# Patient Record
Sex: Male | Born: 1950
Health system: Southern US, Community
[De-identification: ages and names within clinical notes are randomized; demographics above are authoritative.]

## PROBLEM LIST (undated history)

## (undated) DIAGNOSIS — I10 Essential (primary) hypertension: Secondary | ICD-10-CM

## (undated) DIAGNOSIS — R011 Cardiac murmur, unspecified: Secondary | ICD-10-CM

## (undated) DIAGNOSIS — J302 Other seasonal allergic rhinitis: Secondary | ICD-10-CM

## (undated) DIAGNOSIS — I350 Nonrheumatic aortic (valve) stenosis: Secondary | ICD-10-CM

## (undated) HISTORY — DX: Other seasonal allergic rhinitis: J30.2

## (undated) HISTORY — DX: Nonrheumatic aortic (valve) stenosis: I35.0

## (undated) HISTORY — DX: Essential (primary) hypertension: I10

## (undated) HISTORY — DX: Cardiac murmur, unspecified: R01.1

---

## 2009-06-01 ENCOUNTER — Ambulatory Visit: Payer: Self-pay | Admitting: Family Medicine

## 2010-10-18 ENCOUNTER — Encounter: Payer: Self-pay | Admitting: Family Medicine

## 2011-09-12 ENCOUNTER — Other Ambulatory Visit (INDEPENDENT_AMBULATORY_CARE_PROVIDER_SITE_OTHER): Payer: 59

## 2011-09-12 ENCOUNTER — Other Ambulatory Visit: Payer: Self-pay | Admitting: Family Medicine

## 2011-09-12 DIAGNOSIS — Z139 Encounter for screening, unspecified: Secondary | ICD-10-CM

## 2011-09-12 DIAGNOSIS — Z111 Encounter for screening for respiratory tuberculosis: Secondary | ICD-10-CM

## 2011-09-14 LAB — TB SKIN TEST: TB Skin Test: NEGATIVE

## 2011-09-14 LAB — RUBEOLA ANTIBODY IGG: Rubeola IgG: 1.65 {ISR} — ABNORMAL HIGH

## 2011-09-14 LAB — VARICELLA ZOSTER ANTIBODY, IGG: Varicella IgG: 3.34 {ISR} — ABNORMAL HIGH

## 2011-09-14 LAB — MUMPS ANTIBODY, IGG: Mumps IgG: 1.28 {ISR} — ABNORMAL HIGH

## 2011-11-08 ENCOUNTER — Telehealth: Payer: Self-pay | Admitting: *Deleted

## 2011-11-08 ENCOUNTER — Ambulatory Visit (INDEPENDENT_AMBULATORY_CARE_PROVIDER_SITE_OTHER): Payer: 59 | Admitting: Family Medicine

## 2011-11-08 ENCOUNTER — Encounter: Payer: Self-pay | Admitting: Family Medicine

## 2011-11-08 VITALS — BP 150/98 | HR 68 | Temp 98.2°F | Ht 69.0 in | Wt 175.0 lb

## 2011-11-08 DIAGNOSIS — J309 Allergic rhinitis, unspecified: Secondary | ICD-10-CM

## 2011-11-08 DIAGNOSIS — R011 Cardiac murmur, unspecified: Secondary | ICD-10-CM

## 2011-11-08 MED ORDER — TRIAMCINOLONE ACETONIDE 0.1 % EX CREA: TOPICAL_CREAM | Freq: Two times a day (BID) | CUTANEOUS | Status: AC

## 2011-11-08 NOTE — Patient Instructions (Signed)
For your allergies:  Use EITHER claritin (loratidine) or Zyrtec (cetirizine) once daily.  Do NOT use the D version of these medications, since they can raise blood pressure, and your BP was high.  If having sinus pain, try sinus rinses or Neti-pot.  The medications for allergies likely will also treat the eye allergy symptoms.  If it isn't enough, then try drops such as Naphcon-A, or Zaditor, or other antihistamine/anti-inflammatory drops.  Use the steroid cream sparingly, only to the affected areas of skin.  Do not use it longterm.    We are going to be referring you to the cardiologists to have an echocardiogram--this is an ultrasound of the heart to evaluate the heart murmur heard today, to see if there are any valvular abnormalities.  Your blood pressure was elevated today.  Please follow a low sodium diet, avoid decongestants (sinus medications, "D" meds), and periodically check BP at pharmacy or work.  Normal is <135/85, ideally even lower.  2 Gram Low Sodium Diet A 2 gram sodium diet restricts the amount of sodium in the diet to no more than 2 g or 2000 mg daily. Limiting the amount of sodium is often used to help lower blood pressure. It is important if you have heart, liver, or kidney problems. Many foods contain sodium for flavor and sometimes as a preservative. When the amount of sodium in a diet needs to be low, it is important to know what to look for when choosing foods and drinks. The following includes some information and guidelines to help make it easier for you to adapt to a low sodium diet. QUICK TIPS  Do not add salt to food.   Avoid convenience items and fast food.   Choose unsalted snack foods.   Buy lower sodium products, often labeled as "lower sodium" or "no salt added."   Check food labels to learn how much sodium is in 1 serving.   When eating at a restaurant, ask that your food be prepared with less salt or none, if possible.  READING FOOD LABELS FOR SODIUM  INFORMATION The nutrition facts label is a good place to find how much sodium is in foods. Look for products with no more than 500 to 600 mg of sodium per meal and no more than 150 mg per serving. Remember that 2 g = 2000 mg. The food label may also list foods as:  Sodium-free: Less than 5 mg in a serving.   Very low sodium: 35 mg or less in a serving.   Low-sodium: 140 mg or less in a serving.   Light in sodium: 50% less sodium in a serving. For example, if a food that usually has 300 mg of sodium is changed to become light in sodium, it will have 150 mg of sodium.   Reduced sodium: 25% less sodium in a serving. For example, if a food that usually has 400 mg of sodium is changed to reduced sodium, it will have 300 mg of sodium.  CHOOSING FOODS Grains  Avoid: Salted crackers and snack items. Some cereals, including instant hot cereals. Bread stuffing and biscuit mixes. Seasoned rice or pasta mixes.   Choose: Unsalted snack items. Low-sodium cereals, oats, puffed wheat and rice, shredded wheat. English muffins and bread. Pasta.  Meats  Avoid: Salted, canned, smoked, spiced, pickled meats, including fish and poultry. Bacon, ham, sausage, cold cuts, hot dogs, anchovies.   Choose: Low-sodium canned tuna and salmon. Fresh or frozen meat, poultry, and fish.  Dairy  Avoid: Processed cheese and spreads. Cottage cheese. Buttermilk and condensed milk. Regular cheese.   Choose: Milk. Low-sodium cottage cheese. Yogurt. Sour cream. Low-sodium cheese.  Fruits and Vegetables  Avoid: Regular canned vegetables. Regular canned tomato sauce and paste. Frozen vegetables in sauces. Olives. Rosita Fire. Relishes. Sauerkraut.   Choose: Low-sodium canned vegetables. Low-sodium tomato sauce and paste. Frozen or fresh vegetables. Fresh and frozen fruit.  Condiments  Avoid: Canned and packaged gravies. Worcestershire sauce. Tartar sauce. Barbecue sauce. Soy sauce. Steak sauce. Ketchup. Onion, garlic, and table  salt. Meat flavorings and tenderizers.   Choose: Fresh and dried herbs and spices. Low-sodium varieties of mustard and ketchup. Lemon juice. Tabasco sauce. Horseradish.  SAMPLE 2 GRAM SODIUM MEAL PLAN Breakfast / Sodium (mg)  1 cup low-fat milk / 143 mg   2 slices whole-wheat toast / 270 mg   1 tbs heart-healthy margarine / 153 mg   1 hard-boiled egg / 139 mg   1 small orange / 0 mg  Lunch / Sodium (mg)  1 cup raw carrots / 76 mg    cup hummus / 298 mg   1 cup low-fat milk / 143 mg    cup red grapes / 2 mg   1 whole-wheat pita bread / 356 mg  Dinner / Sodium (mg)  1 cup whole-wheat pasta / 2 mg   1 cup low-sodium tomato sauce / 73 mg   3 oz lean ground beef / 57 mg   1 small side salad (1 cup raw spinach leaves,  cup cucumber,  cup yellow bell pepper) with 1 tsp olive oil and 1 tsp red wine vinegar / 25 mg  Snack / Sodium (mg)  1 container low-fat vanilla yogurt / 107 mg   3 graham cracker squares / 127 mg  Nutrient Analysis  Calories: 2033   Protein: 77 g   Carbohydrate: 282 g   Fat: 72 g   Sodium: 1971 mg  Document Released: 04/09/2005 Document Revised: 03/29/2011 Document Reviewed: 07/11/2009 [facility] Patient Information 2012 [place], [place].

## 2011-11-08 NOTE — Progress Notes (Signed)
Chief Complaint  Patient presents with  . Allergies    itchy eyes and feet seasonally. Also has been sneezing a lot. Patient brings a list ans states that loratadine, triamcinolone cream and erythromycin eye drops are effective in helping his symptoms he is out of these meds and requests rx.   HPI:  Symptoms developed yesterday after cutting the grass. He has symptoms every year this time, affected by being outside, mowing and walking on the grass.  He has not used any OTC medications. In the past has used loratidine for allergies, and erythromycin for his eyes--hasn't used in a while.  Denies any significant crusting of the eyes, but are watery, itchy and sometimes red.  Rash on top of his foot, which he relates to wearing socks. TAC cream was helpful in resolving rash in the past.+itchy.  Denies weeping, redness, warmth or fever  Past Medical History  Diagnosis Date  . Seasonal allergies     History reviewed. No pertinent past surgical history.  History   Social History  . Marital Status: Married    Spouse Name: N/A    Number of Children: N/A  . 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 Smoking status: Never Smoker   . Smokeless tobacco: Never Used  . Alcohol Use: Yes     1 drink per month.  . Drug Use: No  . Sexually Active: Not on file   Other Topics Concern  . Not on file   Social History Narrative  . No narrative on file   Current outpatient prescriptions:triamcinolone cream (KENALOG) 0.1 %, Apply topically 2 (two) times daily. Use sparingly, twice daily, until rash resolved., Disp: 45 g, Rfl: 0  No Known Allergies  ROS:  Denies fevers, sinus pain, sore throat, cough, shortness of breath.  Some postnasal drip.  Denies chest pain, edema, or other concerns except as noted in HPI.  Denies headaches, dizziness  PHYSICAL EXAM: BP 150/98  Pulse 68  Temp 98.2 F (36.8 C) (Oral)  Ht 5\' 9"  (1.753 m)  Wt 175  lb (79.379 kg)  BMI 25.84 kg/m2 152/96 by MD on RA Well developed male, in no distress. HEENT:  PERRL, EOMI, conjunctiva clear.  OP clear.  Nasal mucosa moderately edematous, pale. No purulence.  Sinuses nontender.  TMs and EAC's normal Neck: no lymphadenopathy, thyromegaly Heart: regular rate and rhythm.  SEM heard throughout, blowing/high pitched at base.   Lungs: clear bilaterally with good air movement Abdomen: soft, nontender Extremities: no edema. Skin: Hyperpigmented area on top of feet. Somewhat dry. No erythema, warmth, drainage  ASSESSMENT/PLAN: 1. Heart murmur  2D Echocardiogram without contrast  2. Allergic rhinitis, cause unspecified     Allergies--OTC antihistamines (ie claritin or zyrtec). Avoid decongestants.  Sinus rinses prn.  This should help with eye symptoms as well, and if it doesn't, then use OTC eye drops.  Discussed why ABX are not indicated, no evidence of bacterial infection.  Heart murmur--echocardiogram ordered  Elevated BP--low sodium diet, check BP elsewhere periodically  Return for CPE

## 2011-11-08 NOTE — Telephone Encounter (Signed)
Left message for patient to let him know that he is scheduled @ [facility] [address] [place] 11/15/11 @ 10:30.

## 2011-11-15 ENCOUNTER — Encounter: Payer: Self-pay | Admitting: Family Medicine

## 2011-11-15 ENCOUNTER — Ambulatory Visit (HOSPITAL_COMMUNITY): Payer: 59 | Attending: Cardiology | Admitting: Radiology

## 2011-11-15 DIAGNOSIS — R011 Cardiac murmur, unspecified: Secondary | ICD-10-CM

## 2011-11-15 DIAGNOSIS — I350 Nonrheumatic aortic (valve) stenosis: Secondary | ICD-10-CM | POA: Insufficient documentation

## 2011-11-15 DIAGNOSIS — I080 Rheumatic disorders of both mitral and aortic valves: Secondary | ICD-10-CM | POA: Insufficient documentation

## 2011-11-15 NOTE — Progress Notes (Signed)
Echocardiogram performed.  

## 2011-11-20 NOTE — Progress Notes (Signed)
Called pt cell # left message to please call us back

## 2011-12-18 ENCOUNTER — Ambulatory Visit (INDEPENDENT_AMBULATORY_CARE_PROVIDER_SITE_OTHER): Payer: 59 | Admitting: Cardiovascular Disease

## 2011-12-18 ENCOUNTER — Ambulatory Visit (INDEPENDENT_AMBULATORY_CARE_PROVIDER_SITE_OTHER)
Admission: RE | Admit: 2011-12-18 | Discharge: 2011-12-18 | Disposition: A | Discharge status: Home / Self Care | Payer: 59 | Source: Ambulatory Visit | Attending: Cardiovascular Disease | Admitting: Cardiovascular Disease

## 2011-12-18 ENCOUNTER — Encounter: Payer: Self-pay | Admitting: Cardiovascular Disease

## 2011-12-18 VITALS — BP 153/85 | HR 89 | Ht 69.0 in | Wt 175.4 lb

## 2011-12-18 DIAGNOSIS — I350 Nonrheumatic aortic (valve) stenosis: Secondary | ICD-10-CM

## 2011-12-18 DIAGNOSIS — I359 Nonrheumatic aortic valve disorder, unspecified: Secondary | ICD-10-CM

## 2011-12-18 DIAGNOSIS — I10 Essential (primary) hypertension: Secondary | ICD-10-CM

## 2011-12-18 LAB — BASIC METABOLIC PANEL
BUN: 18 mg/dL (ref 6–23)
Creatinine, Ser: 1.1 mg/dL (ref 0.4–1.5)
GFR: 73.61 mL/min (ref >60.00)

## 2011-12-18 LAB — BRAIN NATRIURETIC PEPTIDE: Pro B Natriuretic peptide (BNP): 7 pg/mL (ref 0.0–100.0)

## 2011-12-18 MED ORDER — AMLODIPINE BESYLATE 5 MG PO TABS
5.0000 mg | ORAL_TABLET | Freq: Every day | ORAL | Status: DC
Start: 2011-12-18 — End: 2014-03-25

## 2011-12-18 MED ORDER — IOHEXOL 350 MG/ML SOLN
100.0000 mL | Freq: Once | INTRAVENOUS | Status: AC | PRN
  Administered 2011-12-18: 100 mL via INTRAVENOUS

## 2011-12-18 NOTE — Patient Instructions (Addendum)
Your physician recommends that you have lab work today: BMP and BNP  Non-Cardiac CT Angiography (CTA), is a special type of CT scan that uses a computer to produce multi-dimensional views of major blood vessels throughout the body. In CT angiography, a contrast material is injected through an IV to help visualize the blood vessels--CTA of Chest with contrast  Your physician has requested that you have an exercise tolerance test with Dr Excell Seltzer within 1-2 WEEKS . For further information please visit [email or web address] Please also follow instruction sheet, as given.  Your physician has recommended you make the following change in your medication: START Amlodipine 5mg  take one by mouth daily   You do need SBE prophylaxis prior to dental procedures:  Amoxicillin 500mg  take 4 tablets one hour prior to dental work

## 2011-12-18 NOTE — Progress Notes (Signed)
HPI:  61 year old gentleman presenting for initial cardiac evaluation. The patient was recently seen by Dr. Lynelle Doctor and was noted to have a cardiac murmur. He was referred for an echocardiogram that demonstrated severe aortic stenosis. He is now referred for further evaluation.  This was the first the patient has never been told of a heart murmur. He's had several job physicals but this has never been discussed with him in the past. He has no known history of congenital heart disease, coronary disease, or other cardiac problems. 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  He has no symptoms at this time. He specifically denies chest pain or pressure, dyspnea, lightheadedness, or syncope. He's had no palpitations, edema, orthopnea, or PND. The patient has never been hospitalized. He's had no past surgeries. In order to test his symptoms, he actually ran up and down stairs several times before the visit. He had no symptoms with that level of activity. He exercises a few times per week with pushups and weightlifting, but he has not engaged in any routine aerobic exercise. He does not recall any history of rheumatic fever or other prolonged illness as a child. He has no family members who have had valvular heart problems or congenital heart disease.  Outpatient Encounter Prescriptions as of 12/18/2011  Medication Sig Dispense Refill  . triamcinolone cream (KENALOG) 0.1 % Apply topically 2 (two) times daily. Use sparingly, twice daily, until rash resolved.  45 g  0    Review of patient's allergies indicates no known allergies.  Past Medical History  Diagnosis Date  . Seasonal allergies     No past surgical history on file.  History   Social History  . Marital Status: Married    Spouse Name: N/A    Number of Children: N/A  . 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 Smoking status: Never Smoker   . Smokeless tobacco: Never Used  . Alcohol Use: Yes     1 drink per month.  . Drug Use: No  . Sexually Active: Not on file   Other Topics Concern  . Not on file   Social History Narrative  . No narrative on file   Family history: Negative for coronary disease, sudden death, congenital heart disease, or valvular heart problems  ROS: General: no fevers/chills/night sweats Eyes: no blurry vision, diplopia, or amaurosis ENT: no sore throat or hearing loss Resp: no cough, wheezing, or hemoptysis CV: no edema or palpitations GI: no abdominal pain, nausea, vomiting, diarrhea, or constipation GU: no dysuria, frequency, or hematuria Skin: no rash Neuro: no headache, numbness, tingling, or weakness of extremities Musculoskeletal: no joint pain or swelling Heme: no bleeding, DVT, or easy bruising Endo: no polydipsia or polyuria  BP 153/85  Pulse 89  Ht 5\' 9"  (1.753 m)  Wt 175 lb 6.4 oz (79.561 kg)  BMI 25.90 kg/m2  PHYSICAL EXAM: Pt is alert and oriented, WD, WN, in no distress. HEENT: normal Neck: JVP normal. Carotid upstrokes normal without bruits. No thyromegaly. Lungs: equal expansion, clear bilaterally CV: Apex is discrete and nondisplaced, RRR with a grade 3/6 harsh crescendo decrescendo murmur heard throughout. The A2 component second heart sound is heard. There is no diastolic murmur present. Abd: soft, NT, +BS, no bruit, no hepatosplenomegaly Back: no CVA tenderness Ext: no C/C/E  Femoral pulses 2+= without bruits        DP/PT pulses intact and = Skin: warm and dry without rash Neuro: CNII-XII intact             Strength intact = bilaterally  EKG:  Sinus rhythm 85 beats per minute, right bundle branch block.  2D ECHO: Left ventricle: The cavity size was normal. There was mild concentric hypertrophy. Systolic function was normal. The estimated ejection fraction was in the range of 55% to 60%. Wall motion  was normal; there were no regional wall motion abnormalities. Doppler parameters are consistent with abnormal left ventricular relaxation (grade 1 diastolic dysfunction).  ------------------------------------------------------------ Aortic valve: Trileaflet; severely calcified leaflets. Doppler: There was severe stenosis. Mild regurgitation. VTI ratio of LVOT to aortic valve: 0.19. Indexed valve area: 0.4cm^2/m^2 (VTI). Peak velocity ratio of LVOT to aortic valve: 0.19. Indexed valve area: 0.4cm^2/m^2 (Vmax). Mean gradient: 55mm Hg (S). Peak gradient: 78mm Hg (S).  ------------------------------------------------------------ Aorta: Aortic root: The aortic root was normal in size. Ascending aorta: The ascending aorta was mildly dilated.  ------------------------------------------------------------ Mitral valve: Structurally normal valve. Leaflet separation was normal. Doppler: Transvalvular velocity was within the normal range. There was no evidence for stenosis. Trivial regurgitation. Peak gradient: 3mm Hg (D).  ------------------------------------------------------------ Left atrium: The atrium was mildly dilated.  ------------------------------------------------------------ Right ventricle: The cavity size was normal. Wall thickness was normal. Systolic function was normal.  ------------------------------------------------------------ Pulmonic valve: Poorly visualized. Doppler: No significant regurgitation.  ------------------------------------------------------------ Tricuspid valve: Structurally normal valve. Leaflet separation was normal. Doppler: Transvalvular velocity was within the normal range. No regurgitation.  ------------------------------------------------------------ Right atrium: The atrium was at the upper limits of normal in size.  ------------------------------------------------------------ Pericardium: There was no pericardial  effusion.  ------------------------------------------------------------ Systemic veins: Inferior vena cava: The vessel was dilated; the respirophasic diameter changes were blunted (< 50%); findings are consistent with elevated central venous pressure.  ------------------------------------------------------------  2D measurements Normal Doppler measurements Normal Left ventricle Left ventricle LVID ED, 38.3 mm 43-52 Ea, lat 8.66 cm/s ------ chord, ann, tiss PLAX DP LVID ES, 23.5 mm 23-38 E/Ea, lat 9.46 ------ chord, ann, tiss PLAX DP FS, chord, 39 % >29 Ea, med 6.8 cm/s ------ PLAX ann, tiss LVPW, ED 13.4 mm ------ DP IVS/LVPW 0.78 <1.3 E/Ea, med 12.0 ------ ratio, ED ann, tiss 4 Ventricular septum DP IVS, ED 10.5 mm ------ LVOT LVOT Peak vel, 82 cm/s ------ Diam, S 23 mm ------ S Area 4.15 cm^2 ------ VTI, S 19.8 cm ------ Diam 23 mm ------ Stroke vol 82.3 ml ------ Aorta Stroke 42.4 ml/m^2 ------ Root diam, 30 mm ------ index ED Aortic valve AAo AP 41 mm ------ Peak vel, 441 cm/s ------ diam, S S Left atrium Mean vel, 359 cm/s ------ AP dim 35 mm ------ S AP dim 1.8 cm/m^2 <2.2 VTI, S 106 cm ------ index Mean 55 mm Hg ------ gradient, S Peak 78 mm Hg ------ gradient, S VTI ratio 0.19 ------ LVOT/AV Area index 0.4 cm^2/m ------ (VTI) ^2 Peak vel 0.19 ------ ratio, LVOT/AV Area index 0.4 cm^2/m ------ (Vmax) ^2 Regurg PHT 794 ms ------ Mitral valve Peak E vel 81.9 cm/s ------ Peak A vel 104 cm/s ------ Decelerati 239 ms 150-23 on time 0 Peak 3 mm Hg ------ gradient, D Peak E/A 0.8 ------ ratio Right ventricle Sa vel, 13.7 cm/s ------ lat ann, tiss DP  ASSESSMENT AND PLAN:

## 2011-12-18 NOTE — Assessment & Plan Note (Signed)
This patient has severe aortic stenosis by echocardiography. I have personally reviewed his echo images. I suspect he has a bicuspid aortic valve based on his age at presentation. The other disease process and the differential diagnosis is rheumatic valvular disease, but his mitral valve appears morphologically normal. I think this is highly unlikely. Because I suspect bicuspid aortic valve stenosis, I have recommended the patient have a CT angiogram of the chest to rule out aortic root aneurysm.  I had along discussion with the patient and his wife regarding the ramifications of severe aortic stenosis. There is clearly some controversy about treatment in this gentleman who is completely asymptomatic at a good work level. I think this is a good area for exercise treadmill testing and this will allow [place] to rule out high risk features. If he performs well on his exercise treadmill study in has no hemodynamic or EKG problems identified, I would be inclined to manage him medically for now. I will discuss this further with them when I see him back in followup. Will also check a BNP as there is some prognostic value added in patients with aortic stenosis. We'll have further discussion with the patient and his wife when he returns to review his CT scan and exercise treadmill study. He will need to follow SBE prophylaxis.

## 2011-12-18 NOTE — Progress Notes (Signed)
i think this was suppose to go to you

## 2011-12-18 NOTE — Assessment & Plan Note (Signed)
The patient has had consistently elevated blood pressure. We'll give him a trial of amlodipine 5 mg daily.

## 2011-12-28 ENCOUNTER — Encounter: Payer: Self-pay | Admitting: Cardiovascular Disease

## 2011-12-28 ENCOUNTER — Ambulatory Visit (INDEPENDENT_AMBULATORY_CARE_PROVIDER_SITE_OTHER): Payer: 59 | Admitting: Cardiovascular Disease

## 2011-12-28 DIAGNOSIS — I350 Nonrheumatic aortic (valve) stenosis: Secondary | ICD-10-CM

## 2011-12-28 DIAGNOSIS — I359 Nonrheumatic aortic valve disorder, unspecified: Secondary | ICD-10-CM

## 2011-12-28 MED ORDER — AMOXICILLIN 500 MG PO CAPS: ORAL_CAPSULE | ORAL | Status: DC

## 2011-12-28 NOTE — Addendum Note (Signed)
Addended by: Lisabeth Devoid F on: 12/28/2011 12:30 PM   Modules accepted: Orders

## 2011-12-28 NOTE — Procedures (Signed)
Exercise Treadmill Test  Pre-Exercise Testing Evaluation Rhythm: normal sinus /rbbb Rate: 69   PR:  .14 QRS:  .15  QT:  .42 QTc: .45     Test  Exercise Tolerance Test Ordering MD: Tonny Bollman, MD  Interpreting MD: Tonny Bollman, MD  Unique Test No: 1  Treadmill:  1  Indication for ETT: known ASHD  Contraindication to ETT: No   Stress Modality: exercise - treadmill  Cardiac Imaging Performed: non   Protocol: standard Bruce - maximal  Max BP:  176/94  Max MPHR (bpm):  162 85% MPR (bpm):  138  MPHR obtained (bpm):  164 % MPHR obtained:  101  Reached 85% MPHR (min:sec): 5:00 Total Exercise Time (min-sec):  9:00  Workload in METS:  10.1 Borg Scale: 15  Reason ETT Terminated:  desired heart rate attained    ST Segment Analysis At Rest: normal ST segments - no evidence of significant ST depression With Exercise: no evidence of significant ST depression  Other Information Arrhythmia:  Yes Angina during ETT:  absent (0) Quality of ETT:  diagnostic  ETT Interpretation:  normal - no evidence of ischemia by ST analysis  Comments: Good exercise tolerance. No chest pain or significant ST changes with exertion. Normal BP response to exercise. Isolated PVC's in recovery.

## 2012-01-02 ENCOUNTER — Encounter: Payer: 59 | Admitting: Cardiothoracic Surgery

## 2012-01-02 ENCOUNTER — Telehealth: Payer: Self-pay | Admitting: Cardiovascular Disease

## 2012-01-02 NOTE — Telephone Encounter (Signed)
After several calls to  Mr. Carl Walker, to inform him of an appointment with Dr. Tyrone Sage on 01-02-12 @ 4 pm I was able to speak  with Carl Walker  last night.  She stated that they discussed what Dr. Excell Seltzer had told them and the findings was not an emergency. They don't wan to keep the appointment.  Will call back when ready to see Dr. Tyrone Sage.

## 2012-01-02 NOTE — Telephone Encounter (Signed)
I will forward this message to Dr Excell Seltzer as an Lorain Childes. The pt cancelled appointment with Dr Tyrone Sage.  No further follow-up scheduled at this time.

## 2012-06-20 ENCOUNTER — Ambulatory Visit (INDEPENDENT_AMBULATORY_CARE_PROVIDER_SITE_OTHER): Payer: 59 | Admitting: Cardiovascular Disease

## 2012-06-20 ENCOUNTER — Encounter: Payer: Self-pay | Admitting: Cardiovascular Disease

## 2012-06-20 VITALS — BP 148/98 | HR 69 | Ht 69.0 in | Wt 178.0 lb

## 2012-06-20 DIAGNOSIS — I10 Essential (primary) hypertension: Secondary | ICD-10-CM

## 2012-06-20 DIAGNOSIS — I359 Nonrheumatic aortic valve disorder, unspecified: Secondary | ICD-10-CM

## 2012-06-20 DIAGNOSIS — I350 Nonrheumatic aortic (valve) stenosis: Secondary | ICD-10-CM

## 2012-06-20 LAB — HEPATIC FUNCTION PANEL
Albumin: 4.2 g/dL (ref 3.5–5.2)
Alkaline Phosphatase: 48 U/L (ref 39–117)
Bilirubin, Direct: 0.1 mg/dL (ref 0.0–0.3)
Total Bilirubin: 0.8 mg/dL (ref 0.3–1.2)

## 2012-06-20 LAB — BASIC METABOLIC PANEL
Calcium: 9.4 mg/dL (ref 8.4–10.5)
GFR: 80.24 mL/min (ref >60.00)
Glucose, Bld: 95 mg/dL (ref 70–99)
Sodium: 139 mEq/L (ref 135–145)

## 2012-06-20 LAB — CBC
HCT: 42.5 % (ref 39.0–52.0)
MCHC: 33.3 g/dL (ref 30.0–36.0)
MCV: 86.8 fl (ref 78.0–100.0)
RDW: 12.9 % (ref 11.5–14.6)

## 2012-06-20 LAB — LIPID PANEL
HDL: 53.9 mg/dL (ref >39.00)
VLDL: 7.2 mg/dL (ref 0.0–40.0)

## 2012-06-20 LAB — BRAIN NATRIURETIC PEPTIDE: Pro B Natriuretic peptide (BNP): 9 pg/mL (ref 0.0–100.0)

## 2012-06-20 NOTE — Patient Instructions (Addendum)
Your physician has requested that you have an echocardiogram. Echocardiography is a painless test that uses sound waves to create images of your heart. It provides your doctor with information about the size and shape of your heart and how well your heart's chambers and valves are working. This procedure takes approximately one hour. There are no restrictions for this procedure.  Your physician recommends that you have lab work today: CBC, BMP, LIVER, LIPID and BNP  Your physician wants you to follow-up in: 6 MONTHS with Dr Excell Seltzer.  You will receive a reminder letter in the mail two months in advance. If you don't receive a letter, please call our office to schedule the follow-up appointment.  Your physician has requested that you regularly monitor and record your blood pressure readings at home. Please use the same machine at the same time of day to check your readings and record them to bring to your follow-up visit.

## 2012-06-20 NOTE — Progress Notes (Signed)
HPI:   62 year old gentleman presenting for followup of severe aortic stenosis. The patient was diagnosed with severe aortic stenosis last year. His mean transaortic valve gradient was 55 mm mercury. His left ventricular function is normal. The patient has been asymptomatic and he specifically denies shortness of breath, chest pain with exertion or at rest, palpitations, lightheadedness, or syncope. He travel to [place] recently for 3 weeks and when he came back he noted that his blood pressure was lower. He stopped taking amlodipine. His wife thinks he has whitecoat hypertension as his blood pressure is elevated today.  Outpatient Encounter Prescriptions as of 06/20/2012  Medication Sig Dispense Refill  . amLODipine (NORVASC) 5 MG tablet Take 1 tablet (5 mg total) by mouth daily.  90 tablet  3  . amoxicillin (AMOXIL) 500 MG capsule Take 4 capsules 1 hour prior to your dental procedure  4 capsule  1  . triamcinolone cream (KENALOG) 0.1 % Apply topically 2 (two) times daily. Use sparingly, twice daily, until rash resolved.  45 g  0   No facility-administered encounter medications on file as of 06/20/2012.    No Known Allergies  Past Medical History  Diagnosis Date  . Seasonal allergies     ROS: Negative except as per HPI  BP 148/98  Pulse 69  Ht 5\' 9"  (1.753 m)  Wt 80.74 kg (178 lb)  BMI 26.27 kg/m2  SpO2 98%  PHYSICAL EXAM: Pt is alert and oriented, NAD HEENT: normal Neck: JVP - normal, carotids show parvus et tardus Lungs: CTA bilaterally CV: RRR with grade 3/6 harsh cresendo decrescendo murmur loudest at the RUSB. The second heart sound is preserved. Abd: soft, NT, Positive BS, no hepatomegaly Ext: no C/C/E, distal pulses intact and equal Skin: warm/dry no rash  EKG:  NSR with RBBB, HR 69 bpm.  Echo 11/15/2011: Left ventricle: The cavity size was normal. There was mild concentric hypertrophy. Systolic function was normal. The estimated ejection fraction was in the range  of 55% to 60%. Wall motion was normal; there were no regional wall motion abnormalities. Doppler parameters are consistent with abnormal left ventricular relaxation (grade 1 diastolic dysfunction).  ------------------------------------------------------------ Aortic valve: Trileaflet; severely calcified leaflets. Doppler: There was severe stenosis. Mild regurgitation. VTI ratio of LVOT to aortic valve: 0.19. Indexed valve area: 0.4cm^2/m^2 (VTI). Peak velocity ratio of LVOT to aortic valve: 0.19. Indexed valve area: 0.4cm^2/m^2 (Vmax). Mean gradient: 55mm Hg (S). Peak gradient: 78mm Hg (S).  ------------------------------------------------------------ Aorta: Aortic root: The aortic root was normal in size. Ascending aorta: The ascending aorta was mildly dilated.  ------------------------------------------------------------ Mitral valve: Structurally normal valve. Leaflet separation was normal. Doppler: Transvalvular velocity was within the normal range. There was no evidence for stenosis. Trivial regurgitation. Peak gradient: 3mm Hg (D).  ------------------------------------------------------------ Left atrium: The atrium was mildly dilated.  ------------------------------------------------------------ Right ventricle: The cavity size was normal. Wall thickness was normal. Systolic function was normal.  ------------------------------------------------------------ Pulmonic valve: Poorly visualized. Doppler: No significant regurgitation.  ------------------------------------------------------------ Tricuspid valve: Structurally normal valve. Leaflet separation was normal. Doppler: Transvalvular velocity was within the normal range. No regurgitation.  ------------------------------------------------------------ Right atrium: The atrium was at the upper limits of normal in size.  ------------------------------------------------------------ Pericardium: There was no  pericardial effusion.  ------------------------------------------------------------ Systemic veins: Inferior vena cava: The vessel was dilated; the respirophasic diameter changes were blunted (< 50%); findings are consistent with elevated central venous pressure.  ------------------------------------------------------------  2D measurements Normal Doppler measurements Normal Left ventricle Left ventricle LVID ED, 38.3 mm 43-52 Ea,  lat 8.66 cm/s ------ chord, ann, tiss PLAX DP LVID ES, 23.5 mm 23-38 E/Ea, lat 9.46 ------ chord, ann, tiss PLAX DP FS, chord, 39 % >29 Ea, med 6.8 cm/s ------ PLAX ann, tiss LVPW, ED 13.4 mm ------ DP IVS/LVPW 0.78 <1.3 E/Ea, med 12.0 ------ ratio, ED ann, tiss 4 Ventricular septum DP IVS, ED 10.5 mm ------ LVOT LVOT Peak vel, 82 cm/s ------ Diam, S 23 mm ------ S Area 4.15 cm^2 ------ VTI, S 19.8 cm ------ Diam 23 mm ------ Stroke vol 82.3 ml ------ Aorta Stroke 42.4 ml/m^2 ------ Root diam, 30 mm ------ index ED Aortic valve AAo AP 41 mm ------ Peak vel, 441 cm/s ------ diam, S S Left atrium Mean vel, 359 cm/s ------ AP dim 35 mm ------ S AP dim 1.8 cm/m^2 <2.2 VTI, S 106 cm ------ index Mean 55 mm Hg ------ gradient, S Peak 78 mm Hg ------ gradient, S VTI ratio 0.19 ------ LVOT/AV Area index 0.4 cm^2/m ------ (VTI) ^2 Peak vel 0.19 ------ ratio, LVOT/AV Area index 0.4 cm^2/m ------ (Vmax) ^2 Regurg PHT 794 ms ------ Mitral valve Peak E vel 81.9 cm/s ------ Peak A vel 104 cm/s ------ Decelerati 239 ms 150-23 on time 0 Peak 3 mm Hg ------ gradient, D Peak E/A 0.8 ------ ratio Right ventricle Sa vel, 13.7 cm/s ------ lat ann, tiss DP  Chest CTA: IMPRESSION:  1. Mild ectasia of the ascending thoracic aorta measuring  approximately 4.1 cm in greatest transverse axial dimension.  2. Incidental note is made of a ductus diverticulum. No evidence  of thoracic aortic dissection.  3. Extensive calcification within the  aortic valve leaflets  compatible with provided history of aortic stenosis.  4. Possible 1.4 cm hemangioma versus minimally complex cyst within  the dome of the left lobe of the liver, incompletely characterized.  ASSESSMENT AND PLAN: 1. Severe aortic stenosis, asymptomatic. I have reviewed the patient's echocardiogram, chest CTA, an exercise treadmill test all done approximately 6 months ago. He achieved 10.1 metabolic equivalents, exercising 9 minutes on a stress test with normal blood pressure response, no angina, and no significant EKG changes. I remain concerned about the very high gradient across his aortic valve. I am going to recheck an echocardiogram to assess for progression of his aortic stenosis. Also we'll check a BNP level as this can be helpful in asymptomatic patients. Will review with him further when his tests are completed.  2. Hypertension. Isolated blood pressure elevation today. His wife is a Publishing rights manager and we'll check his blood pressure periodically at home. If his readings are greater than 140/90, I recommended that he restart amlodipine 5 mg daily.  Tonny Bollman 06/20/2012 11:12 AM

## 2012-07-04 ENCOUNTER — Ambulatory Visit (HOSPITAL_COMMUNITY): Payer: 59 | Attending: Cardiology | Admitting: Radiology

## 2012-07-04 DIAGNOSIS — I10 Essential (primary) hypertension: Secondary | ICD-10-CM

## 2012-07-04 DIAGNOSIS — I350 Nonrheumatic aortic (valve) stenosis: Secondary | ICD-10-CM

## 2012-07-04 DIAGNOSIS — I359 Nonrheumatic aortic valve disorder, unspecified: Secondary | ICD-10-CM

## 2012-07-04 NOTE — Progress Notes (Signed)
Echocardiogram performed.  

## 2012-12-15 ENCOUNTER — Telehealth: Payer: Self-pay | Admitting: Cardiovascular Disease

## 2012-12-15 DIAGNOSIS — I359 Nonrheumatic aortic valve disorder, unspecified: Secondary | ICD-10-CM

## 2012-12-15 NOTE — Telephone Encounter (Signed)
Spoke with patient, discussed echo results from 07/04/12 and Dr Earmon Phoenix recommendation to refer to Dr Tyrone Sage for consideration of AVR. Pt is aware I will make the referral to Dr Tyrone Sage,  pt states Fridays the best day.

## 2012-12-15 NOTE — Telephone Encounter (Signed)
New Prob  Pt states he was supposed to call and speak with Dr Excell Seltzer.  He said he does not know what it was about but he said he missed a call from him but he was out of town and just got the message.

## 2012-12-15 NOTE — Telephone Encounter (Signed)
Reviewed chart. Pt is asymptomatic but has very high transaortic gradients suggesting higher risk. It has been nearly one year since his treadmill. Recommend repeat treadmill and echo in September before seeing Dr Tyrone Sage so that he has updated clinical information. Please schedule his treadmill for me to do rather than PA/NP. thx  Tonny Bollman 12/15/2012 10:24 AM

## 2012-12-18 NOTE — Addendum Note (Signed)
Addended by: Iona Coach on: 12/18/2012 01:31 PM   Modules accepted: Orders

## 2012-12-24 ENCOUNTER — Encounter: Payer: 59 | Admitting: Cardiothoracic Surgery

## 2012-12-29 ENCOUNTER — Telehealth: Payer: Self-pay | Admitting: Cardiovascular Disease

## 2012-12-29 NOTE — Telephone Encounter (Signed)
New problem    C/O discomfort after eating a meal.

## 2012-12-29 NOTE — Telephone Encounter (Signed)
He has not tried antacids, PPIs or anything else to relieve the symptoms. I will forward to Dr Excell Seltzer for review.

## 2012-12-29 NOTE — Telephone Encounter (Signed)
Pt advised,verbalized understanding. 

## 2012-12-29 NOTE — Telephone Encounter (Signed)
Try daily PPI (OTC prilosec is fine). Call if symptoms persist.

## 2012-12-29 NOTE — Telephone Encounter (Signed)
Spoke with patient. Pt states for about 2 weeks he has been having discomfort in his chest after every meal.  He states it does not happen at any other time. It lasts for about 30-45 minutes. He denies any other symptoms including SOB, dizziness, syncope.

## 2013-01-16 ENCOUNTER — Ambulatory Visit (HOSPITAL_COMMUNITY): Payer: 59 | Attending: Cardiovascular Disease

## 2013-01-16 ENCOUNTER — Ambulatory Visit (INDEPENDENT_AMBULATORY_CARE_PROVIDER_SITE_OTHER): Payer: 59 | Admitting: Cardiovascular Disease

## 2013-01-16 DIAGNOSIS — I359 Nonrheumatic aortic valve disorder, unspecified: Secondary | ICD-10-CM

## 2013-01-16 DIAGNOSIS — I10 Essential (primary) hypertension: Secondary | ICD-10-CM | POA: Insufficient documentation

## 2013-01-16 DIAGNOSIS — I517 Cardiomegaly: Secondary | ICD-10-CM | POA: Insufficient documentation

## 2013-01-16 DIAGNOSIS — I079 Rheumatic tricuspid valve disease, unspecified: Secondary | ICD-10-CM | POA: Insufficient documentation

## 2013-01-16 NOTE — Progress Notes (Signed)
Exercise Treadmill Test  Pre-Exercise Testing Evaluation Rhythm: normal sinus  Rate: 68                 Test  Exercise Tolerance Test Ordering MD: Tonny Bollman, MD  Interpreting MD: Tonny Bollman, MD  Unique Test No: 1  Treadmill:  1  Indication for ETT: Severe AS, HTN  Contraindication to ETT: No   Stress Modality: exercise - treadmill  Cardiac Imaging Performed: non   Protocol: standard Bruce - maximal  Max BP:  185/108  Max MPHR (bpm):  160 85% MPR (bpm):  136  MPHR obtained (bpm):  162 % MPHR obtained:  100  Reached 85% MPHR (min:sec):  4:44 Total Exercise Time (min-sec):  8:00  Workload in METS:  9.7 Borg Scale: 17  Reason ETT Terminated:  fatigue    ST Segment Analysis At Rest: normal ST segments - no evidence of significant ST depression With Exercise: no evidence of significant ST depression  Other Information Arrhythmia:  No Angina during ETT:  absent (0) Quality of ETT:  diagnostic  ETT Interpretation:  normal - no evidence of ischemia by ST analysis  Comments: Pt with severe aortic stenosis. Good exercise capacity. No arrhythmia, angina, or significant ST changes at this level of exertion (reached 100% max predicted HR). Normal BP response to exercise (did not take his antihypertensive this am).  Recommendations: Will review echo for further recommendations.

## 2013-01-16 NOTE — Progress Notes (Signed)
Echocardiogram performed.  

## 2013-01-22 ENCOUNTER — Encounter: Payer: Self-pay | Admitting: Cardiothoracic Surgery

## 2013-01-22 ENCOUNTER — Institutional Professional Consult (permissible substitution) (INDEPENDENT_AMBULATORY_CARE_PROVIDER_SITE_OTHER): Payer: 59 | Admitting: Cardiothoracic Surgery

## 2013-01-22 VITALS — BP 152/95 | HR 69 | Resp 18 | Ht 69.0 in | Wt 174.0 lb

## 2013-01-22 DIAGNOSIS — I350 Nonrheumatic aortic (valve) stenosis: Secondary | ICD-10-CM

## 2013-01-22 DIAGNOSIS — I10 Essential (primary) hypertension: Secondary | ICD-10-CM | POA: Insufficient documentation

## 2013-01-22 DIAGNOSIS — I351 Nonrheumatic aortic (valve) insufficiency: Secondary | ICD-10-CM

## 2013-01-22 DIAGNOSIS — I359 Nonrheumatic aortic valve disorder, unspecified: Secondary | ICD-10-CM

## 2013-01-22 NOTE — Progress Notes (Signed)
[address]       [place] [address]             [phone]                    Kennth Vanbenschoten [facility] Medical Record #[id number] Date of Birth: [birth year]  Referring: Tonny Bollman, MD Primary Care: Ernst Breach, PA-C  Chief Complaint:    Chief Complaint  Patient presents with  . Aortic Stenosis    Surgical eval on severe aortic stenosis, ECHO 01/16/13,    History of Present Illness:    Patient is a 62 year old [place] who was diagnosed with aortic stenosis in 2013. At that time he was being seen by primary care for a routine annual physical and was noted to have a murmur. Further evaluation revealed aortic stenosis with question of bicuspid aortic valve. The patient at that time and continues to deny symptoms of congestive heart failure, denies angina, denies syncope or presyncope. 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 He does note some shortness of breath if he climbs a flight of stairs quickly. He has had no previous cardiac history. He has no family history of valvular heart disease or family history of aortic dissection. He had an echocardiogram done last March and again last week. An exercise stress test was also performed last week.  With the patient's critical aortic stenosis based on echo findings he has been referred to discuss aortic valve replacement. In addition recent CT scan revealed a dilated descending aorta at approximately 4.1 cm.    Current Activity/ Functional Status:  Patient is independent with mobility/ambulation, transfers, ADL's, IADL's.  Zubrod Score: At the time of surgery this patient's most appropriate activity status/level should be described as: [x]  Normal activity, no symptoms []  Symptoms, fully ambulatory []  Symptoms, in bed less than or equal to 50% of the time []  Symptoms, in bed greater than 50% of the time but less than 100% []  Bedridden []  Moribund   Past  Medical History  Diagnosis Date  . Seasonal allergies   . Severe aortic stenosis   . Hypertension   . Heart murmur     No Previous Surgery  Family History: Patient's father is deceased from accidental death, his mother is alive with high blood pressure, he has 2 sisters who are alive without known cardiac disease, he has 3 children who are alive and healthy  History   Social History  . Marital Status: Married    Spouse Name: N/A    Number of Children: 3  . 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 Smoking status: Never Smoker   . Smokeless tobacco: Never Used  . Alcohol Use: Yes     Comment: 1 drink per month.  . Drug Use: No  .     Patient's married, his wife is supportive, she is trained as a Publishing rights manager and currently works doing home nursing through [place] [employer] [facility]   History  Smoking status  . Never Smoker   Smokeless tobacco  . Never Used    History  Alcohol Use  . Yes    Comment: 1 drink per month.     No Known Allergies  Current Outpatient Prescriptions  Medication Sig Dispense Refill  . amoxicillin (AMOXIL) 500 MG capsule Take 4 capsules 1 hour prior to  your dental procedure  4 capsule  1  . omeprazole (PRILOSEC) 20 MG capsule Daily as needed      . amLODipine (NORVASC) 5 MG tablet Take 1 tablet (5 mg total) by mouth daily.  90 tablet  3   No current facility-administered medications for this visit.       Review of Systems:     Cardiac Review of Systems: Y or N  Chest Pain [ n   ]  Resting SOB [  n ] Exertional SOB  [ n ]  Orthopnea [n  ]   Pedal Edema [ n  ]    Palpitations [n  ] Syncope  [ n ]   Presyncope [ n  ]  General Review of Systems: [Y] = yes [  ]=no Constitional: recent weight change [n  ]; anorexia [ n ]; fatigue [some  ]; nausea [ n ]; night sweats [n  ]; fever [n  ]; or chills [  n];                                                                                                                                           Dental: poor dentition[n  ]; Last Dentist visit: last year  Eye : blurred vision [  ]; diplopia [   ]; vision changes [n  ];  Amaurosis fugax[  ]; Resp: cough [  ];  wheezing[  ];  hemoptysis[  ]; shortness of breath[  ]; paroxysmal nocturnal dyspnea[  ]; dyspnea on exertion[  ]; or orthopnea[  ];  GI:  gallstones[  ], vomiting[  ];  dysphagia[  ]; melena[n  ];  hematochezia Milo.Brash  ]; heartburn[ n ];   Hx of  Colonoscopy[ n ]; GU: kidney stones [ n ]; hematuria[ n ];   dysuria [  ];  nocturia[  ];  history of     obstruction [  ]; urinary frequency [  ]             Skin: rash, swelling[  ];, hair loss[  ];  peripheral edema[  ];  or itching[  ]; Musculosketetal: myalgias[  ];  joint swelling[  ];  joint erythema[  ];  joint pain[  ];  back pain[  ];  Heme/Lymph: bruising[n  ];  bleeding[n  ];  anemia[  ];  Neuro: TIA[  ];  headaches[n  ];  stroke[n  ];  vertigo[  ];  seizures[  ];   paresthesias[  ];  difficulty walking[ n ];  Psych:depression[  ]; anxiety[  ];  Endocrine: diabetes[  ];  thyroid dysfunction[  ];  Immunizations: Flu [ refused ]; Pneumococcal[  ];  Other:  Physical Exam: BP 152/95  Pulse 69  Resp 18  Ht 5\' 9"  (1.753 m)  Wt 174 lb (78.926 kg)  BMI 25.68 kg/m2  SpO2 99%  General appearance: alert,  cooperative, appears stated age and no distress Neurologic: intact Heart: regular rate and rhythm and systolic murmur: holosystolic 3/6, crescendo throughout the precordium Lungs: clear to auscultation bilaterally Abdomen: soft, non-tender; bowel sounds normal; no masses,  no organomegaly Extremities: extremities normal, atraumatic, no cyanosis or edema and Homans sign is negative, no sign of DVT Patient has bilateral carotid bruits radiating from the aortic valve, has full and equal radial and pedal pulses   Diagnostic Studies & Laboratory data:     Recent Radiology Findings:   RADIOLOGY REPORT*  Clinical Data:  History of aortic stenosis, evaluate for thoracic  aortic aneurysm  CT ANGIOGRAPHY CHEST WITH CONTRAST  Technique: Multidetector CT imaging of the chest was performed  using the standard protocol during bolus administration of  intravenous contrast. Multiplanar CT image reconstructions  including MIPs were obtained to evaluate the vascular anatomy.  Contrast: OMNIPAQUE IOHEXOL 350 MG/ML SOLN  Comparison: None.  Vascular Findings:  Evaluation of the proximal aspect of the ascending thoracic aorta  is degraded secondary to pulsation artifact.  The proximal aspect of the ascending thoracic aorta is of normal  caliber as measured just cranial to the sinuses of Valsalva,  measuring approximately 2.9 cm in greatest oblique coronal  dimension (image 36, series 602).  There is mild ectasia of the proximal ascending thoracic aorta  measuring approximately 4.1 x 4.0 cm in greatest oblique axial  dimension (image 40, series 4, as measured at the level of the main  pulmonary artery), 3.6 cm in greatest oblique coronal dimension  (image 33, series 602) and approximately 3.9 cm in greatest oblique  sagittal dimension (image 64, series 603).  The thoracic aorta tapers to a normal caliber at the level of the  aortic arch with the distal aspect of the aortic arch measuring  approximately 2.4 cm in greatest oblique sagittal dimension (image  80, series 603). Normal configuration of the aortic arch. The  visualized portions of the cervical vasculature are patent.  There is smooth as saccular dilatation of the anterior aspect of  the proximal descending thoracic aorta measuring approximately 3.2  cm in greatest oblique sagittal dimension (image 82, series 603)  suggestive of a Ductus diverticulum.  The descending thoracic aorta then tapers to a normal caliber  measuring approximately 2.5 x 2.7 cm in greatest transverse oblique  dimension (as measured at the level of the main pulmonary artery -    image 38, series four).  The descending thoracic aorta is mildly tortuous but of normal  caliber. The distal aspect of the thoracic aorta measures  approximately 2.4 x 2.4 cm as measured at the level of the  diaphragmatic hiatus (image 84, series four).  No evidence of thoracic aortic dissection. No periaortic  stranding.  Borderline cardiomegaly. Extensive calcifications within the  aortic valve leaflets. No pericardial effusion.  Although this examination was not tailored for evaluation of the  pulmonary arteries, there is no discrete filling defect within the  colon arterial tree to suggest acute pulmonary embolism. Normal  caliber of the main pulmonary artery.  Review of the MIP images confirms the above findings.  Nonvascular findings:  Minimal dependent ground-glass atelectasis. There is minimal  asymmetric bilateral dependent pleural parenchymal thickening.  Pleural parenchymal thickening is noted about the left lung apex.  No focal airspace opacities. No pulmonary nodules. No pleural  effusion or pneumothorax.  No mediastinal, hilar or axillary lymphadenopathy.  Limited early arterial phase evaluation of the upper abdomen  demonstrates an approximately 1.4 x  0.9 cm hypoattenuating lesion  within the dome of the left lobe of the liver (image 73, series 4)  which may demonstrate minimal peripheral nodular enhancement and  may represent hepatic hemangioma though incompletely characterized.  There is an additional sub centimeter hypoattenuating lesion with  the dome of the right lobe of the liver (image 70) which is too  small to accurately characterize.  No acute or aggressive osseous abnormalities.  IMPRESSION:  1. Mild ectasia of the ascending thoracic aorta measuring  approximately 4.1 cm in greatest transverse axial dimension.  2. Incidental note is made of a ductus diverticulum. No evidence  of thoracic aortic dissection.  3. Extensive calcification within the aortic  valve leaflets  compatible with provided history of aortic stenosis.  4. Possible 1.4 cm hemangioma versus minimally complex cyst within  the dome of the left lobe of the liver, incompletely characterized.  Original Report Authenticated By: Waynard Reeds, M.D.  ECHO:                            06/2012                       01/16/2013 AVA       .75    .68 Mean Grad           60                                      57 Peak Grad           98                                       90 Valve vel             496                                      475  No history Cardiac cath  Recent Lab Findings: Lab Results  Component Value Date   WBC 3.4* 06/20/2012   HGB 14.2 06/20/2012   HCT 42.5 06/20/2012   PLT 174.0 06/20/2012   GLUCOSE 95 06/20/2012   CHOL 166 06/20/2012   TRIG 36.0 06/20/2012   HDL 53.90 06/20/2012   LDLCALC 105* 06/20/2012   ALT 13 06/20/2012   AST 17 06/20/2012   NA 139 06/20/2012   K 3.8 06/20/2012   CL 105 06/20/2012   CREATININE 1.0 06/20/2012   BUN 16 06/20/2012   CO2 28 06/20/2012      Assessment / Plan:   1 Critical AS by serial echo, Patient denies symptoms  2 ectasia of the ascending thoracic aorta measuring   approximately 4.1 cm in greatest transverse axial dimension  Had extensive discussion about the diagnosis of critical aortic stenosis, symptoms involved and prognosis with the patient and his wife. Although he denies symptoms and appears active he has echo evidence of critical aortic stenosis, and CT evidence of mild dilatation of the ascending aorta. I discussed with him the need for aortic valve replacement and replacement of a descending aorta in the near future, the risks and options involved. We have discussed the valve choices including tissue  and mechanical and pros and cons of each.  The patient would like to wait until next summer/year from now before proceeding. With the degree of aortic stenosis and the peak gradient currently I've discouraged this. He would  like some time to consider his options. I have made him an appointment to return to see me in approximately 4 weeks for further discussion about timing of surgery. He will need cardiac catheterization before any surgical intervention. At this point he is considering a tissue valve as he frequently travels to [place] and is concerned monitoring his ProTime. I've suggested to him that if he proceeded with surgery in the next 3-4 months he would be fully recovered and could travel to [place] next June.   Delight Ovens MD      [address] [place] [place] [address] Office [phone]   Beeper [phone]  01/22/2013 10:22 AM

## 2013-02-26 ENCOUNTER — Other Ambulatory Visit: Payer: Self-pay

## 2013-02-26 ENCOUNTER — Ambulatory Visit: Payer: 59 | Admitting: Cardiothoracic Surgery

## 2013-02-26 NOTE — Progress Notes (Deleted)
[address]       [place] [address]             [phone]                    Nivan Melendrez [facility] Medical Record #[id number] Date of Birth: [birth year]  Referring: Jac Canavan, PA-C Primary Care: Ernst Breach, PA-C  Chief Complaint:    No chief complaint on file.   History of Present Illness:    Patient is a 62 year old [place] who was diagnosed with aortic stenosis in 2013. At that time he was being seen by primary care for a routine annual physical and was noted to have a murmur. Further evaluation revealed aortic stenosis with question of bicuspid aortic valve. The patient at that time and continues to deny symptoms of congestive heart failure, denies angina, denies syncope or presyncope. 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 He does note some shortness of breath if he climbs a flight of stairs quickly. He has had no previous cardiac history. He has no family history of valvular heart disease or family history of aortic dissection. He had an echocardiogram done last March and again last week. An exercise stress test was also performed last week.  With the patient's critical aortic stenosis based on echo findings he has been referred to discuss aortic valve replacement. In addition recent CT scan revealed a dilated descending aorta at approximately 4.1 cm.    Current Activity/ Functional Status:  Patient is independent with mobility/ambulation, transfers, ADL's, IADL's.  Zubrod Score: At the time of surgery this patient's most appropriate activity status/level should be described as: [x]  Normal activity, no symptoms []  Symptoms, fully ambulatory []  Symptoms, in bed less than or equal to 50% of the time []  Symptoms, in bed greater than 50% of the time but less than 100% []  Bedridden []  Moribund   Past Medical History  Diagnosis Date  . Seasonal allergies   . Severe aortic stenosis   .  Hypertension   . Heart murmur     No Previous Surgery  Family History: Patient's father is deceased from accidental death, his mother is alive with high blood pressure, he has 2 sisters who are alive without known cardiac disease, he has 3 children who are alive and healthy  History   Social History  . Marital Status: Married    Spouse Name: N/A    Number of Children: 3  . 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 Smoking status: Never Smoker   . Smokeless tobacco: Never Used  . Alcohol Use: Yes     Comment: 1 drink per month.  . Drug Use: No  .     Patient's married, his wife is supportive, she is trained as a Publishing rights manager and currently works doing home nursing through [place] [employer]   History  Smoking status  . Never Smoker   Smokeless tobacco  . Never Used    History  Alcohol Use  . Yes    Comment: 1 drink per month.     No Known Allergies  Current Outpatient Prescriptions  Medication Sig Dispense Refill  . amLODipine (NORVASC) 5 MG tablet Take 1 tablet (5 mg total) by mouth daily.  90 tablet  3  . amoxicillin (AMOXIL) 500 MG capsule Take 4  capsules 1 hour prior to your dental procedure  4 capsule  1  . omeprazole (PRILOSEC) 20 MG capsule Daily as needed       No current facility-administered medications for this visit.       Review of Systems:     Cardiac Review of Systems: Y or N  Chest Pain [ n   ]  Resting SOB [  n ] Exertional SOB  [ n ]  Orthopnea [n  ]   Pedal Edema [ n  ]    Palpitations [n  ] Syncope  [ n ]   Presyncope [ n  ]  General Review of Systems: [Y] = yes [  ]=no Constitional: recent weight change [n  ]; anorexia [ n ]; fatigue [some  ]; nausea [ n ]; night sweats [n  ]; fever [n  ]; or chills [  n];                                                                                                                                          Dental: poor dentition[n  ];  Last Dentist visit: last year  Eye : blurred vision [  ]; diplopia [   ]; vision changes [n  ];  Amaurosis fugax[  ]; Resp: cough [  ];  wheezing[  ];  hemoptysis[  ]; shortness of breath[  ]; paroxysmal nocturnal dyspnea[  ]; dyspnea on exertion[  ]; or orthopnea[  ];  GI:  gallstones[  ], vomiting[  ];  dysphagia[  ]; melena[n  ];  hematochezia Milo.Brash  ]; heartburn[ n ];   Hx of  Colonoscopy[ n ]; GU: kidney stones [ n ]; hematuria[ n ];   dysuria [  ];  nocturia[  ];  history of     obstruction [  ]; urinary frequency [  ]             Skin: rash, swelling[  ];, hair loss[  ];  peripheral edema[  ];  or itching[  ]; Musculosketetal: myalgias[  ];  joint swelling[  ];  joint erythema[  ];  joint pain[  ];  back pain[  ];  Heme/Lymph: bruising[n  ];  bleeding[n  ];  anemia[  ];  Neuro: TIA[  ];  headaches[n  ];  stroke[n  ];  vertigo[  ];  seizures[  ];   paresthesias[  ];  difficulty walking[ n ];  Psych:depression[  ]; anxiety[  ];  Endocrine: diabetes[  ];  thyroid dysfunction[  ];  Immunizations: Flu [ refused ]; Pneumococcal[  ];  Other:  Physical Exam: There were no vitals taken for this visit.  General appearance: alert, cooperative, appears stated age and no distress Neurologic: intact Heart: regular rate and rhythm and systolic murmur: holosystolic 3/6, crescendo throughout the precordium Lungs: clear to auscultation bilaterally Abdomen: soft, non-tender; bowel sounds normal; no  masses,  no organomegaly Extremities: extremities normal, atraumatic, no cyanosis or edema and Homans sign is negative, no sign of DVT Patient has bilateral carotid bruits radiating from the aortic valve, has full and equal radial and pedal pulses   Diagnostic Studies & Laboratory data:     Recent Radiology Findings:   RADIOLOGY REPORT*  Clinical Data: History of aortic stenosis, evaluate for thoracic  aortic aneurysm  CT ANGIOGRAPHY CHEST WITH CONTRAST  Technique: Multidetector CT imaging of the chest  was performed  using the standard protocol during bolus administration of  intravenous contrast. Multiplanar CT image reconstructions  including MIPs were obtained to evaluate the vascular anatomy.  Contrast: OMNIPAQUE IOHEXOL 350 MG/ML SOLN  Comparison: None.  Vascular Findings:  Evaluation of the proximal aspect of the ascending thoracic aorta  is degraded secondary to pulsation artifact.  The proximal aspect of the ascending thoracic aorta is of normal  caliber as measured just cranial to the sinuses of Valsalva,  measuring approximately 2.9 cm in greatest oblique coronal  dimension (image 36, series 602).  There is mild ectasia of the proximal ascending thoracic aorta  measuring approximately 4.1 x 4.0 cm in greatest oblique axial  dimension (image 40, series 4, as measured at the level of the main  pulmonary artery), 3.6 cm in greatest oblique coronal dimension  (image 33, series 602) and approximately 3.9 cm in greatest oblique  sagittal dimension (image 64, series 603).  The thoracic aorta tapers to a normal caliber at the level of the  aortic arch with the distal aspect of the aortic arch measuring  approximately 2.4 cm in greatest oblique sagittal dimension (image  80, series 603). Normal configuration of the aortic arch. The  visualized portions of the cervical vasculature are patent.  There is smooth as saccular dilatation of the anterior aspect of  the proximal descending thoracic aorta measuring approximately 3.2  cm in greatest oblique sagittal dimension (image 82, series 603)  suggestive of a Ductus diverticulum.  The descending thoracic aorta then tapers to a normal caliber  measuring approximately 2.5 x 2.7 cm in greatest transverse oblique  dimension (as measured at the level of the main pulmonary artery -  image 38, series four).  The descending thoracic aorta is mildly tortuous but of normal  caliber. The distal aspect of the thoracic aorta measures    approximately 2.4 x 2.4 cm as measured at the level of the  diaphragmatic hiatus (image 84, series four).  No evidence of thoracic aortic dissection. No periaortic  stranding.  Borderline cardiomegaly. Extensive calcifications within the  aortic valve leaflets. No pericardial effusion.  Although this examination was not tailored for evaluation of the  pulmonary arteries, there is no discrete filling defect within the  colon arterial tree to suggest acute pulmonary embolism. Normal  caliber of the main pulmonary artery.  Review of the MIP images confirms the above findings.  Nonvascular findings:  Minimal dependent ground-glass atelectasis. There is minimal  asymmetric bilateral dependent pleural parenchymal thickening.  Pleural parenchymal thickening is noted about the left lung apex.  No focal airspace opacities. No pulmonary nodules. No pleural  effusion or pneumothorax.  No mediastinal, hilar or axillary lymphadenopathy.  Limited early arterial phase evaluation of the upper abdomen  demonstrates an approximately 1.4 x 0.9 cm hypoattenuating lesion  within the dome of the left lobe of the liver (image 73, series 4)  which may demonstrate minimal peripheral nodular enhancement and  may represent hepatic hemangioma though incompletely  characterized.  There is an additional sub centimeter hypoattenuating lesion with  the dome of the right lobe of the liver (image 70) which is too  small to accurately characterize.  No acute or aggressive osseous abnormalities.  IMPRESSION:  1. Mild ectasia of the ascending thoracic aorta measuring  approximately 4.1 cm in greatest transverse axial dimension.  2. Incidental note is made of a ductus diverticulum. No evidence  of thoracic aortic dissection.  3. Extensive calcification within the aortic valve leaflets  compatible with provided history of aortic stenosis.  4. Possible 1.4 cm hemangioma versus minimally complex cyst within  the dome of  the left lobe of the liver, incompletely characterized.  Original Report Authenticated By: Waynard Reeds, M.D.  ECHO:                            06/2012                       01/16/2013 AVA       .75    .68 Mean Grad           60                                      57 Peak Grad           98                                       90 Valve vel             496                                      475  No history Cardiac cath  Recent Lab Findings: Lab Results  Component Value Date   WBC 3.4* 06/20/2012   HGB 14.2 06/20/2012   HCT 42.5 06/20/2012   PLT 174.0 06/20/2012   GLUCOSE 95 06/20/2012   CHOL 166 06/20/2012   TRIG 36.0 06/20/2012   HDL 53.90 06/20/2012   LDLCALC 105* 06/20/2012   ALT 13 06/20/2012   AST 17 06/20/2012   NA 139 06/20/2012   K 3.8 06/20/2012   CL 105 06/20/2012   CREATININE 1.0 06/20/2012   BUN 16 06/20/2012   CO2 28 06/20/2012   Aortic Size Index=     4.1    /There is no height or weight on file to calculate BSA. =  < 2.75 cm/m2      4% risk per year 2.75 to 4.25          8% risk per year > 4.25 cm/m2    20% risk per year   cross sectional area of aorta cm2/height in meters > 10 consider  surgery size    Assessment / Plan:   1 Critical AS by serial echo, Patient denies symptoms  2 ectasia of the ascending thoracic aorta measuring   approximately 4.1 cm in greatest transverse axial dimension  Had extensive discussion about the diagnosis of critical aortic stenosis, symptoms involved and prognosis with the patient and his wife. Although he denies symptoms and appears active he has echo evidence of critical aortic stenosis,  and CT evidence of mild dilatation of the ascending aorta. I discussed with him the need for aortic valve replacement and replacement of a descending aorta in the near future, the risks and options involved. We have discussed the valve choices including tissue and mechanical and pros and cons of each.  The patient would like to wait until next  summer/year from now before proceeding. With the degree of aortic stenosis and the peak gradient currently I've discouraged this. He would like some time to consider his options. I have made him an appointment to return to see me in approximately 4 weeks for further discussion about timing of surgery. He will need cardiac catheterization before any surgical intervention. At this point he is considering a tissue valve as he frequently travels to [place] and is concerned monitoring his ProTime. I've suggested to him that if he proceeded with surgery in the next 3-4 months he would be fully recovered and could travel to [place] next June.   Delight Ovens MD      [address] [place] [place] [address] Office [phone]   Beeper [phone]  02/26/2013 9:32 AM

## 2013-02-27 NOTE — Progress Notes (Signed)
This encounter was created in error - please disregard.

## 2014-03-25 ENCOUNTER — Other Ambulatory Visit (HOSPITAL_COMMUNITY): Payer: Self-pay | Admitting: Cardiovascular Disease

## 2014-03-26 NOTE — Telephone Encounter (Signed)
Patient has not refilled this in over one year and he is way overdue for follow up. Please advise on refill. Thanks, MI

## 2014-03-29 NOTE — Telephone Encounter (Signed)
Please refill for one month and put instructions on bottle that the pt needs a follow-up appointment.  I have reviewed his T[facility] office note as well and he did not follow up with Dr Tyrone SageGerhardt.  I am unsure if the pt has transitioned his care to another facility.

## 2016-04-23 HISTORY — PX: AORTIC VALVULOPLASTY: SHX142

## 2018-04-27 ENCOUNTER — Emergency Department (HOSPITAL_COMMUNITY): Payer: 59

## 2018-04-27 ENCOUNTER — Emergency Department (HOSPITAL_COMMUNITY)
Admission: EM | Admit: 2018-04-27 | Discharge: 2018-04-27 | Disposition: A | Discharge status: Home / Self Care | Payer: 59 | Attending: Emergency Medicine | Admitting: Emergency Medicine

## 2018-04-27 ENCOUNTER — Encounter (HOSPITAL_COMMUNITY): Payer: Self-pay | Admitting: Emergency Medicine

## 2018-04-27 DIAGNOSIS — Y998 Other external cause status: Secondary | ICD-10-CM | POA: Diagnosis not present

## 2018-04-27 DIAGNOSIS — S299XXA Unspecified injury of thorax, initial encounter: Secondary | ICD-10-CM | POA: Diagnosis not present

## 2018-04-27 DIAGNOSIS — R55 Syncope and collapse: Secondary | ICD-10-CM | POA: Diagnosis not present

## 2018-04-27 DIAGNOSIS — Y939 Activity, unspecified: Secondary | ICD-10-CM | POA: Insufficient documentation

## 2018-04-27 DIAGNOSIS — M25512 Pain in left shoulder: Secondary | ICD-10-CM | POA: Diagnosis not present

## 2018-04-27 DIAGNOSIS — R0789 Other chest pain: Secondary | ICD-10-CM | POA: Diagnosis not present

## 2018-04-27 DIAGNOSIS — S4992XA Unspecified injury of left shoulder and upper arm, initial encounter: Secondary | ICD-10-CM | POA: Diagnosis not present

## 2018-04-27 DIAGNOSIS — Z79899 Other long term (current) drug therapy: Secondary | ICD-10-CM | POA: Insufficient documentation

## 2018-04-27 DIAGNOSIS — I10 Essential (primary) hypertension: Secondary | ICD-10-CM | POA: Insufficient documentation

## 2018-04-27 DIAGNOSIS — S0990XA Unspecified injury of head, initial encounter: Secondary | ICD-10-CM | POA: Diagnosis not present

## 2018-04-27 DIAGNOSIS — Y92410 Unspecified street and highway as the place of occurrence of the external cause: Secondary | ICD-10-CM | POA: Insufficient documentation

## 2018-04-27 DIAGNOSIS — M545 Low back pain, unspecified: Secondary | ICD-10-CM

## 2018-04-27 DIAGNOSIS — M542 Cervicalgia: Secondary | ICD-10-CM | POA: Insufficient documentation

## 2018-04-27 DIAGNOSIS — R52 Pain, unspecified: Secondary | ICD-10-CM | POA: Diagnosis not present

## 2018-04-27 DIAGNOSIS — S199XXA Unspecified injury of neck, initial encounter: Secondary | ICD-10-CM | POA: Diagnosis not present

## 2018-04-27 DIAGNOSIS — I4510 Unspecified right bundle-branch block: Secondary | ICD-10-CM | POA: Diagnosis not present

## 2018-04-27 MED ORDER — CYCLOBENZAPRINE HCL 10 MG PO TABS
10.0000 mg | ORAL_TABLET | Freq: Once | ORAL | Status: AC
  Administered 2018-04-27: 10 mg via ORAL
  Filled 2018-04-27: qty 1

## 2018-04-27 MED ORDER — CYCLOBENZAPRINE HCL 10 MG PO TABS: 10.0000 mg | ORAL_TABLET | Freq: Two times a day (BID) | ORAL | 0 refills | Status: DC | PRN

## 2018-04-27 MED ORDER — ACETAMINOPHEN 500 MG PO TABS
1000.0000 mg | ORAL_TABLET | Freq: Once | ORAL | Status: AC
  Administered 2018-04-27: 1000 mg via ORAL
  Filled 2018-04-27: qty 2
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
## 2018-04-27 NOTE — Discharge Instructions (Addendum)
Alternate 600 mg of ibuprofen and 500-1000 mg of Tylenol every 3 hours as needed for pain. Do not exceed 4000 mg of Tylenol daily. You may take Flexeril up to twice daily as needed for muscle spasms. This medication may make you drowsy, so I typically only recommended at night. If this medication makes you drowsy throughout the day, no driving, drinking alcohol, or operating heavy machinery. You may also cut these tablets in half. Ice to areas of soreness for the next few days and then may move to heat. Do some gentle stretching throughout the day, especially during hot showers or baths. Take short frequent walks and avoid prolonged periods of sitting or laying. Expect to be sore for the next few day and follow up with primary care physician for recheck of ongoing symptoms but return to ER for emergent changing or worsening of symptoms such as severe headache that gets worse, altered mental status/behaving unusually, persistent vomiting, excessive drowsiness, numbness to the arms or legs, unsteady gait, or slurred speech. ° °If your blood pressure (BP) was elevated on multiple readings during this visit above 130 for the top number or above 80 for the bottom number, please have this repeated by your primary care provider within one month. You can also check your blood pressure when you are out at a pharmacy or grocery store. Many have machines that will check your blood pressure.  If your blood pressure remains elevated, please follow-up with your PCP. ° °

## 2018-04-27 NOTE — ED Notes (Signed)
Pt transported to radiology.
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
## 2019-02-12 ENCOUNTER — Encounter: Payer: Self-pay | Admitting: Medical

## 2019-02-12 ENCOUNTER — Ambulatory Visit (INDEPENDENT_AMBULATORY_CARE_PROVIDER_SITE_OTHER): Payer: 59 | Admitting: Medical

## 2019-02-12 ENCOUNTER — Other Ambulatory Visit: Payer: Self-pay

## 2019-02-12 VITALS — BP 130/76 | HR 108 | Temp 98.4°F | Ht 69.0 in | Wt 184.3 lb

## 2019-02-12 DIAGNOSIS — Z7185 Encounter for immunization safety counseling: Secondary | ICD-10-CM | POA: Insufficient documentation

## 2019-02-12 DIAGNOSIS — Z139 Encounter for screening, unspecified: Secondary | ICD-10-CM | POA: Diagnosis not present

## 2019-02-12 DIAGNOSIS — Z7184 Encounter for health counseling related to travel: Secondary | ICD-10-CM | POA: Diagnosis not present

## 2019-02-12 DIAGNOSIS — Z23 Encounter for immunization: Secondary | ICD-10-CM

## 2019-02-12 DIAGNOSIS — Z1159 Encounter for screening for other viral diseases: Secondary | ICD-10-CM | POA: Diagnosis not present

## 2019-02-12 DIAGNOSIS — I10 Essential (primary) hypertension: Secondary | ICD-10-CM

## 2019-02-12 DIAGNOSIS — Z0000 Encounter for general adult medical examination without abnormal findings: Secondary | ICD-10-CM | POA: Diagnosis not present

## 2019-02-12 DIAGNOSIS — Z952 Presence of prosthetic heart valve: Secondary | ICD-10-CM | POA: Diagnosis not present

## 2019-02-12 DIAGNOSIS — Z1211 Encounter for screening for malignant neoplasm of colon: Secondary | ICD-10-CM

## 2019-02-12 DIAGNOSIS — Z125 Encounter for screening for malignant neoplasm of prostate: Secondary | ICD-10-CM | POA: Diagnosis not present

## 2019-02-12 DIAGNOSIS — I350 Nonrheumatic aortic (valve) stenosis: Secondary | ICD-10-CM | POA: Diagnosis not present

## 2019-02-12 DIAGNOSIS — Z7189 Other specified counseling: Secondary | ICD-10-CM

## 2019-02-12 MED ORDER — AMLODIPINE BESYLATE 5 MG PO TABS: 5.0000 mg | ORAL_TABLET | Freq: Every day | ORAL | 0 refills | Status: AC

## 2019-02-12 MED ORDER — MUPIROCIN 2 % EX OINT: 1.0000 "application " | TOPICAL_OINTMENT | Freq: Three times a day (TID) | CUTANEOUS | 0 refills | Status: DC

## 2019-02-12 MED ORDER — ATOVAQUONE-PROGUANIL HCL 250-100 MG PO TABS: ORAL_TABLET | ORAL | 0 refills | Status: DC

## 2019-02-12 MED ORDER — CIPROFLOXACIN HCL 500 MG PO TABS: ORAL_TABLET | ORAL | 0 refills | Status: DC

## 2019-02-12 NOTE — Patient Instructions (Signed)
Thank you for coming in today for travel encounter and physical  I appreciate the fact that you try to take care of yourself through healthy diet and exercise.   I also recommend we update you on preventative measures to keep healthy.  I am listing several recommendations below the routine health recommendations for your age   Recommendations: See an eye doctor yearly for glaucoma screening, also based on your history of high blood pressure which can damage eye.  EYE DOCTOR RECOMMENDATIONS  [facility] [address] [place], [place] [address] Phone: [phone] [email or web address]   [facility] Dr. Gelene Mink [address], [place] [place], [place] [address]  [phone] [email or web address]   Vincenza Hews, M.D. Susanne Greenhouse, O.D. [address] [place], [place] [address] Medical telephone: [phone] Optical telephone: [phone]   Dr. Glenford Peers [address] [place], [place] [address] [phone]   I recommend you see a dentist twice yearly for routine hygiene visits, recommend brushing teeth twice a day, floss daily.  I have listed some dentist recommendation below  DENTIST RECOMMENDATION Dr. Yancey Flemings, dentist [address] [place], [place] [address] [phone] [email or web address]     Cancer screening I recommend a colon cancer screen.  Since you are lower risk, you could do a Cologuard stool test or the colonoscopy.  I recommend you check your insurance to see what they cover.  Let me know which one you want to do when you get back from [place].  I will check a PSA prostate cancer screen on you today  I recommend you check your skin for new skin changes regularly    We will check some routine labs today.  Since you have eaten today and are nonfasting, I cannot check a cholesterol panel today.   Travel counseling: I printed out CDC recommendations for [place]  today.  There are numerous vaccines that are recommended  We did do a copy of your employee health report from [facility] health.  You are up-to-date on tetanus diphtheria pertussis from June 2018 You just had a flu shot recently so you are up-to-date You have had prior labs that show that you are immune to chickenpox varicella as well as rubeola, rubella and mumps.  Vaccines recommended by the CDC that we do not do here is yellow fever and typhoid.  Check with health department on these.  Other vaccines or lab titers we can do here are as follows: Blood test to prove immunity to polio We can update hepatitis A vaccine, 2 shots given 6 months apart    I recommend the following vaccines based on your age: Shingles vaccine, 2 shots given 2 months apart Prevnar 13 pneumococcal vaccine, then pneumococcal 23 vaccine 1 year later I will call your pharmacy to discuss malaria prophylaxis and Cipro to have on hand.  I wind up prescribing Cipro, malaria prophylaxis as well as mupirocin ointment for cuts and scrapes.  I recommend you get purifying tablets for water or use bottled water, or use a physical type of water filtration system while you are there   In general be prepared to take normal hygiene items with you such as toothpaste, floss, toothbrush, soap, facemask, toilet paper, etc.     I recommend you follow-up with your cardiologist    Preventative Care for Adults - Male      MAINTAIN REGULAR HEALTH EXAMS:  A routine yearly physical is a good way to check in  with your primary care provider about your health and preventive screening. It is also an opportunity to share updates about your health and any concerns you have, and receive a thorough all-over exam.   Most health insurance companies pay for at least some preventative services.  Check with your health plan for specific coverages.  WHAT PREVENTATIVE SERVICES DO MEN NEED?  Adult men should have their weight and blood pressure  checked regularly.   Men age 66 and older should have their cholesterol levels checked regularly.  Beginning at age 66 and continuing to age 66, men should be screened for colorectal cancer.  Certain people may need continued testing until age 66000.  Updating vaccinations is part of preventative care.  Vaccinations help protect against diseases such as the flu.  Osteoporosis is a disease in which the bones lose minerals and strength as we age. Men ages 4365 and over should discuss this with their caregivers  Lab tests are generally done as part of preventative care to screen for anemia and blood disorders, to screen for problems with the kidneys and liver, to screen for bladder problems, to check blood sugar, and to check your cholesterol level.  Preventative services generally include counseling about diet, exercise, avoiding tobacco, drugs, excessive alcohol consumption, and sexually transmitted infections.    GENERAL RECOMMENDATIONS FOR GOOD HEALTH:  Healthy diet:  Eat a variety of foods, including fruit, vegetables, animal or vegetable protein, such as meat, fish, chicken, and eggs, or beans, lentils, tofu, and grains, such as rice.  Drink plenty of water daily.  Decrease saturated fat in the diet, avoid lots of red meat, processed foods, sweets, fast foods, and fried foods.  Exercise:  Aerobic exercise helps maintain good heart health. At least 30-40 minutes of moderate-intensity exercise is recommended. For example, a brisk walk that increases your heart rate and breathing. This should be done on most days of the week.   Find a type of exercise or a variety of exercises that you enjoy so that it becomes a part of your daily life.  Examples are running, walking, swimming, water aerobics, and biking.  For motivation and support, explore group exercise such as aerobic class, spin class, Zumba, Yoga,or  martial arts, etc.    Set exercise goals for yourself, such as a certain weight goal,  walk or run in a race such as a 5k walk/run.  Speak to your primary care provider about exercise goals.  Disease prevention:  If you smoke or chew tobacco, find out from your caregiver how to quit. It can literally save your life, no matter how long you have been a tobacco user. If you do not use tobacco, never begin.   Maintain a healthy diet and normal weight. Increased weight leads to problems with blood pressure and diabetes.   The Body Mass Index or BMI is a way of measuring how much of your body is fat. Having a BMI above 27 increases the risk of heart disease, diabetes, hypertension, stroke and other problems related to obesity. Your caregiver can help determine your BMI and based on it develop an exercise and dietary program to help you achieve or maintain this important measurement at a healthful level.  High blood pressure causes heart and blood vessel problems.  Persistent high blood pressure should be treated with medicine if weight loss and exercise do not work.   Fat and cholesterol leaves deposits in your arteries that can block them. This causes heart disease and vessel disease  elsewhere in your body.  If your cholesterol is found to be high, or if you have heart disease or certain other medical conditions, then you may need to have your cholesterol monitored frequently and be treated with medication.   Ask if you should have a cardiac stress test if your history suggests this. A stress test is a test done on a treadmill that looks for heart disease. This test can find disease prior to there being a problem.  Osteoporosis is a disease in which the bones lose minerals and strength as we age. This can result in serious bone fractures. Risk of osteoporosis can be identified using a bone density scan. Men ages 77 and over should discuss this with their caregivers. Ask your caregiver whether you should be taking a calcium supplement and Vitamin D, to reduce the rate of osteoporosis.    Avoid drinking alcohol in excess (more than two drinks per day).  Avoid use of street drugs. Do not share needles with anyone. Ask for professional help if you need assistance or instructions on stopping the use of alcohol, cigarettes, and/or drugs.  Brush your teeth twice a day with fluoride toothpaste, and floss once a day. Good oral hygiene prevents tooth decay and gum disease. The problems can be painful, unattractive, and can cause other health problems. Visit your dentist for a routine oral and dental check up and preventive care every 6-12 months.   Look at your skin regularly.  Use a mirror to look at your back. Notify your caregivers of changes in moles, especially if there are changes in shapes, colors, a size larger than a pencil eraser, an irregular border, or development of new moles.  Safety:  Use seatbelts 100% of the time, whether driving or as a passenger.  Use safety devices such as hearing protection if you work in environments with loud noise or significant background noise.  Use safety glasses when doing any work that could send debris in to the eyes.  Use a helmet if you ride a bike or motorcycle.  Use appropriate safety gear for contact sports.  Talk to your caregiver about gun safety.  Use sunscreen with a SPF (or skin protection factor) of 15 or greater.  Lighter skinned people are at a greater risk of skin cancer. Don't forget to also wear sunglasses in order to protect your eyes from too much damaging sunlight. Damaging sunlight can accelerate cataract formation.   Practice safe sex. Use condoms. Condoms are used for birth control and to help reduce the spread of sexually transmitted infections (or STIs).  Some of the STIs are gonorrhea (the clap), chlamydia, syphilis, trichomonas, herpes, HPV (human papilloma virus) and HIV (human immunodeficiency virus) which causes AIDS. The herpes, HIV and HPV are viral illnesses that have no cure. These can result in disability, cancer  and death.   Keep carbon monoxide and smoke detectors in your home functioning at all times. Change the batteries every 6 months or use a model that plugs into the wall.   Vaccinations:  Stay up to date with your tetanus shots and other required immunizations. You should have a booster for tetanus every 10 years. Be sure to get your flu shot every year, since 5%-20% of the [place] population comes down with the flu. The flu vaccine changes each year, so being vaccinated once is not enough. Get your shot in the fall, before the flu season peaks.   Other vaccines to consider:  Human Papilloma Virus or HPV  causes cancer of the cervix, and other infections that can be transmitted from person to person. There is a vaccine for HPV, and males should get immunized between the ages of 8 and 81. It requires a series of 3 shots.   Pneumococcal vaccine to protect against certain types of pneumonia.  This is normally recommended for adults age 24 or older.  However, adults younger than 66 years old with certain underlying conditions such as diabetes, heart or lung disease should also receive the vaccine.  Shingles vaccine to protect against Varicella Zoster if you are older than age 29, or younger than 66 years old with certain underlying illness.  If you have not had the Shingrix vaccine, please call your insurer to inquire about coverage for the Shingrix vaccine given in 2 doses.   Some insurers cover this vaccine after age 80, some cover this after age 59.  If your insurer covers this, then call to schedule appointment to have this vaccine here  Hepatitis A vaccine to protect against a form of infection of the liver by a virus acquired from food.  Hepatitis B vaccine to protect against a form of infection of the liver by a virus acquired from blood or body fluids, particularly if you work in health care.  If you plan to travel internationally, check with your local health department for specific vaccination  recommendations.   What should I know about cancer screening? Many types of cancers can be detected early and may often be prevented. Lung Cancer  You should be screened every year for lung cancer if: ? You are a current smoker who has smoked for at least 30 years. ? You are a former smoker who has quit within the past 15 years.  Talk to your health care provider about your screening options, when you should start screening, and how often you should be screened.  Colorectal Cancer  Routine colorectal cancer screening usually begins at 66 years of age and should be repeated every 5-10 years until you are 66 years old. You may need to be screened more often if early forms of precancerous polyps or small growths are found. Your health care provider may recommend screening at an earlier age if you have risk factors for colon cancer.  Your health care provider may recommend using home test kits to check for hidden blood in the stool.  A small camera at the end of a tube can be used to examine your colon (sigmoidoscopy or colonoscopy). This checks for the earliest forms of colorectal cancer.  Prostate and Testicular Cancer  Depending on your age and overall health, your health care provider may do certain tests to screen for prostate and testicular cancer.  Talk to your health care provider about any symptoms or concerns you have about testicular or prostate cancer.  Skin Cancer  Check your skin from head to toe regularly.  Tell your health care provider about any new moles or changes in moles, especially if: ? There is a change in a mole's size, shape, or color. ? You have a mole that is larger than a pencil eraser.  Always use sunscreen. Apply sunscreen liberally and repeat throughout the day.  Protect yourself by wearing long sleeves, pants, a wide-brimmed hat, and sunglasses when outside.

## 2019-02-12 NOTE — Progress Notes (Signed)
Subjective:   HPI  Carl Walker is a 68 y.o. male who presents for Chief Complaint  Patient presents with  . Annual Exam    Patient Care Team: , Kermit Baloavid S, PA-C as PCP - General (Family Medicine) Tonny Bollmanooper, Michael, MD as Consulting Physician (Cardiology) Delight OvensGerhardt, Edward B, MD as Consulting Physician (Cardiothoracic Surgery) Sees dentist Sees eye doctor  Concerns: Here is a returning patient.  He is seeing Dr. Lynelle DoctorKnapp.  One time before.  Has not been back for care.  He also has not been back to see his cardiologist in 2 years.  He is status post aortic valve replacement, history of aortic valve stenosis, critical, history of high blood pressure and aortic aneurysm.  He is mainly here today as he is leaving to go back to [place] for 5 months.  His mother is not well he wants to be there with her.  He wants informational vaccine counseling.  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  He has no vaccine records with him today.  He is requesting over 100 tablets of Cipro to take with him and malaria prophylaxis.  His mother is really worried about Covid.  He has been in [place] 35 years, born in [place]  Exercise - run, calisthenics  He reports no health issues in general.  He says he eats healthy.  He is nonfasting today.   Reviewed their medical, surgical, family, social, medication, and allergy history and updated chart as appropriate.  Past Medical History:  Diagnosis Date  . Hypertension   . Seasonal allergies   . Severe aortic stenosis     Past Surgical History:  Procedure Laterality Date  . AORTIC VALVULOPLASTY  2018    Social History   Socioeconomic History  . Marital status: Married    Spouse name: Not on file  . Number of children: 3  . Years of education: Not on file  . Highest education level: Not on file  Occupational History  . 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 Financial resource strain: Not  on file  . Food insecurity    Worry: Not on file    Inability: Not on file  . Transportation needs    Medical: Not on file    Non-medical: Not on file  Tobacco Use  . Smoking status: Never Smoker  . Smokeless tobacco: Never Used  Substance and Sexual Activity  . Alcohol use: Not Currently    Comment: 1 drink per month.  . Drug use: No  . Sexual activity: Not on file  Lifestyle  . Physical activity    Days per week: Not on file    Minutes per session: Not on file  . Stress: Not on file  Relationships  . Social Musicianconnections    Talks on phone: Not on file    Gets together: Not on file    Attends religious service: Not on file    Active member of club or organization: Not on file    Attends meetings of clubs or organizations: Not on file    Relationship status: Not on file  . Intimate partner violence    Fear of current or ex partner: Not on file    Emotionally abused: Not on file    Physically abused: Not on file    Forced sexual activity: Not on file  Other Topics Concern  . Not on file  Social History Narrative   Lives with wife  and 3 adult children.   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 Other Father        died from poisoning  . Other Brother        MVA  . Heart disease Neg Hx   . Stroke Neg Hx   . Diabetes Neg Hx   . Hypertension Neg Hx   . Cancer Neg Hx      Current Outpatient Medications:  .  amLODipine (NORVASC) 5 MG tablet, Take 1 tablet (5 mg total) by mouth daily., Disp: 180 tablet, Rfl: 0 .  atovaquone-proguanil (MALARONE) 250-100 MG TABS tablet, Begin 1 week prior to trip, daily during and 1 week after trip, Disp: 200 tablet, Rfl: 0 .  ciprofloxacin (CIPRO) 500 MG tablet, Use 1 tablet BID x 5 days for travelers diarrhea, BID x 7 days for sinus infection or urinary infection, Disp: 50 tablet, Rfl: 0 .  mupirocin ointment (BACTROBAN) 2 %, Apply 1 application topically 3 (three) times  daily., Disp: 60 g, Rfl: 0  No Known Allergies     Review of Systems Constitutional: -fever, -chills, -sweats, -unexpected weight change, -decreased appetite, -fatigue Allergy: -sneezing, -itching, -congestion Dermatology: -changing moles, --rash, -lumps ENT: -runny nose, -ear pain, -sore throat, -hoarseness, -sinus pain, -teeth pain, - ringing in ears, -hearing loss, -nosebleeds Cardiology: -chest pain, -palpitations, -swelling, -difficulty breathing when lying flat, -waking up short of breath Respiratory: -cough, -shortness of breath, -difficulty breathing with exercise or exertion, -wheezing, -coughing up blood Gastroenterology: -abdominal pain, -nausea, -vomiting, -diarrhea, -constipation, -blood in stool, -changes in bowel movement, -difficulty swallowing or eating Hematology: -bleeding, -bruising  Musculoskeletal: -joint aches, -muscle aches, -joint swelling, -back pain, -neck pain, -cramping, -changes in gait Ophthalmology: denies vision changes, eye redness, itching, discharge Urology: -burning with urination, -difficulty urinating, -blood in urine, -urinary frequency, -urgency, -incontinence Neurology: -headache, -weakness, -tingling, -numbness, -memory loss, -falls, -dizziness Psychology: -depressed mood, -agitation, -sleep problems Male GU: no testicular mass, pain, no lymph nodes swollen, no swelling, no rash.     Objective:  BP 130/76   Pulse (!) 108   Temp 98.4 F (36.9 C)   Ht 5\' 9"  (1.753 m)   Wt 184 lb 4.8 oz (83.6 kg)   SpO2 97%   BMI 27.22 kg/m   General appearance: alert, no distress, WD/WN, African American male Skin: scattered macules, no worrisome lesions HEENT: normocephalic, conjunctiva/corneas normal, sclerae anicteric, PERRLA, EOMi, nares patent, no discharge or erythema, pharynx normal Oral cavity: MMM, tongue normal, teeth normal Neck: supple, no lymphadenopathy, no thyromegaly, no masses, normal ROM, no bruits Chest: vertical surgical scar, non  tender, normal shape and expansion Heart: 3/6 holosystolic murmur heard throughout,  RRR, normal S1, S2 otherwise  Lungs: CTA bilaterally, no wheezes, rhonchi, or rales Abdomen: +bs, soft, non tender, non distended, no masses, no hepatomegaly, no splenomegaly, no bruits Back: non tender, normal ROM, no scoliosis Musculoskeletal: upper extremities non tender, no obvious deformity, normal ROM throughout, lower extremities non tender, no obvious deformity, normal ROM throughout Extremities: no edema, no cyanosis, no clubbing Pulses: 2+ symmetric, upper and 1+ lower extremities, normal cap refill Neurological: alert, oriented x 3, CN2-12 intact, strength normal upper extremities and lower extremities, sensation normal throughout, DTRs 2+ throughout, no cerebellar signs, gait normal Psychiatric: normal affect, behavior normal, pleasant  GU: normal male external genitalia,circumcised, nontender, no masses, no hernia, no lymphadenopathy Rectal: declined   Assessment and Plan :  Encounter Diagnoses  Name Primary?  . Encounter for health maintenance examination in adult Yes  . Travel advice encounter   . Vaccine counseling   . Screening for prostate cancer   . Screen for colon cancer   . Screening for condition   . Aortic valve stenosis, etiology of cardiac valve disease unspecified   . S/P AVR (aortic valve replacement)   . Essential hypertension, benign   . Encounter for hepatitis C screening test for low risk patient   . Need for hepatitis A immunization     Physical exam - discussed and counseled on healthy lifestyle, diet, exercise, preventative care, vaccinations, sick and well care, proper use of emergency dept and after hours care, and addressed their concerns.    Health screening: See your eye doctor yearly for routine vision care. See your dentist yearly for routine dental care including hygiene visits twice yearly.  Cancer screening Colonoscopy:  Discussed colonoscopy vs  cologard.  He will consider when he returns from [place]  Discussed PSA, prostate exam, and prostate cancer screening risks/benefits.     Counseled on the Hepatitis A virus vaccine.  Vaccine information sheet given.  Hepatitis A vaccine given after consent obtained.  Patient was advised to return in 6 months for Hep A #2.     Reviewed the following with him.  Thank you for coming in today for travel encounter and physical  I appreciate the fact that you try to take care of yourself through healthy diet and exercise.   I also recommend we update you on preventative measures to keep healthy.  I am listing several recommendations below the routine health recommendations for your age   Recommendations: See an eye doctor yearly for glaucoma screening, also based on your history of high blood pressure which can damage eye.  EYE DOCTOR RECOMMENDATIONS  [facility] [address] [place], [place] [address] Phone: [phone] [email or web address]   [facility] Dr. Gelene Mink [address], [place] [place], [place] [address]  [phone] [email or web address]   Vincenza Hews, M.D. Susanne Greenhouse, O.D. [address] [place], [place] [address] Medical telephone: [phone] Optical telephone: [phone]   Dr. Glenford Peers [address] [place], [place] [address] [phone]   I recommend you see a dentist twice yearly for routine hygiene visits, recommend brushing teeth twice a day, floss daily.  I have listed some dentist recommendation below  DENTIST RECOMMENDATION Dr. Yancey Flemings, dentist [address], [place], [place] [address] [phone] [email or web address]     Cancer screening I recommend a colon cancer screen.  Since you are lower risk, you could do a Cologuard stool test or the colonoscopy.  I recommend you check your insurance to see what they cover.  Let me know which one you want  to do when you get back from [place].  I will check a PSA prostate cancer screen on you today  I recommend you check your skin for new skin changes regularly    We will check some routine labs today.  Since you have eaten today and are nonfasting, I cannot check a cholesterol panel today.   Travel counseling: I printed out CDC recommendations for [place] today.  There are numerous vaccines that are recommended  We did do a copy of your employee health report from [facility] health.  You are up-to-date on tetanus diphtheria pertussis from June 2018 You just had a flu shot recently so you are up-to-date  You have had prior labs that show that you are immune to chickenpox varicella as well as rubeola, rubella and mumps.  Vaccines recommended by the CDC that we do not do here is yellow fever and typhoid.  Check with health department on these.  Other vaccines or lab titers we can do here are as follows: Blood test to prove immunity to polio We can update hepatitis A vaccine, 2 shots given 6 months apart    I recommend the following vaccines based on your age: Shingles vaccine, 2 shots given 2 months apart Prevnar 13 pneumococcal vaccine, then pneumococcal 23 vaccine 1 year later I will call your pharmacy to discuss malaria prophylaxis and Cipro to have on hand.  I wind up prescribing Cipro, malaria prophylaxis as well as mupirocin ointment for cuts and scrapes.  I recommend you get purifying tablets for water or use bottled water, or use a physical type of water filtration system while you are there   In general be prepared to take normal hygiene items with you such as toothpaste, floss, toothbrush, soap, facemask, toilet paper, etc.     I recommend you follow-up with your cardiologist    Enrico was seen today for annual exam.  Diagnoses and all orders for this visit:  Encounter for health maintenance examination in adult -     Comprehensive metabolic panel -     CBC with  Differential/Platelet -     PSA -     Hemoglobin A1c -     Hepatitis C antibody  Travel advice encounter  Vaccine counseling -     Hepatitis A vaccine adult IM  Screening for prostate cancer -     PSA  Screen for colon cancer  Screening for condition  Aortic valve stenosis, etiology of cardiac valve disease unspecified  S/P AVR (aortic valve replacement)  Essential hypertension, benign  Encounter for hepatitis C screening test for low risk patient -     Hepatitis C antibody  Need for hepatitis A immunization -     Hepatitis A vaccine adult IM  Other orders -     mupirocin ointment (BACTROBAN) 2 %; Apply 1 application topically 3 (three) times daily. -     atovaquone-proguanil (MALARONE) 250-100 MG TABS tablet; Begin 1 week prior to trip, daily during and 1 week after trip -     ciprofloxacin (CIPRO) 500 MG tablet; Use 1 tablet BID x 5 days for travelers diarrhea, BID x 7 days for sinus infection or urinary infection -     amLODipine (NORVASC) 5 MG tablet; Take 1 tablet (5 mg total) by mouth daily.    Follow-up pending labs, yearly for physical

## 2019-02-13 LAB — CBC WITH DIFFERENTIAL/PLATELET
Basophils Absolute: 0 10*3/uL (ref 0.0–0.2)
Basos: 1 %
EOS (ABSOLUTE): 0.3 10*3/uL (ref 0.0–0.4)
Eos: 6 %
Hematocrit: 46.6 % (ref 37.5–51.0)
Hemoglobin: 15 g/dL (ref 13.0–17.7)
Immature Grans (Abs): 0 10*3/uL (ref 0.0–0.1)
Immature Granulocytes: 0 %
Lymphocytes Absolute: 1.8 10*3/uL (ref 0.7–3.1)
Lymphs: 41 %
MCH: 28.2 pg (ref 26.6–33.0)
MCHC: 32.2 g/dL (ref 31.5–35.7)
MCV: 88 fL (ref 79–97)
Monocytes Absolute: 0.4 10*3/uL (ref 0.1–0.9)
Monocytes: 8 %
Neutrophils Absolute: 1.9 10*3/uL (ref 1.4–7.0)
Neutrophils: 44 %
Platelets: 181 10*3/uL (ref 150–450)
RBC: 5.32 x10E6/uL (ref 4.14–5.80)
RDW: 13 % (ref 11.6–15.4)
WBC: 4.4 10*3/uL (ref 3.4–10.8)

## 2019-02-13 LAB — COMPREHENSIVE METABOLIC PANEL
ALT: 10 IU/L (ref 0–44)
AST: 15 IU/L (ref 0–40)
Albumin/Globulin Ratio: 1.5 (ref 1.2–2.2)
Albumin: 4.4 g/dL (ref 3.8–4.8)
Alkaline Phosphatase: 66 IU/L (ref 39–117)
BUN/Creatinine Ratio: 12 (ref 10–24)
BUN: 17 mg/dL (ref 8–27)
Bilirubin Total: 0.5 mg/dL (ref 0.0–1.2)
CO2: 23 mmol/L (ref 20–29)
Calcium: 9.9 mg/dL (ref 8.6–10.2)
Chloride: 104 mmol/L (ref 96–106)
Creatinine, Ser: 1.37 mg/dL — ABNORMAL HIGH (ref 0.76–1.27)
GFR calc Af Amer: 62 mL/min/{1.73_m2} (ref >59)
GFR calc non Af Amer: 53 mL/min/{1.73_m2} — ABNORMAL LOW (ref >59)
Globulin, Total: 3 g/dL (ref 1.5–4.5)
Glucose: 120 mg/dL — ABNORMAL HIGH (ref 65–99)
Potassium: 3.7 mmol/L (ref 3.5–5.2)
Sodium: 141 mmol/L (ref 134–144)
Total Protein: 7.4 g/dL (ref 6.0–8.5)

## 2019-02-13 LAB — PSA: Prostate Specific Ag, Serum: 3.1 ng/mL (ref 0.0–4.0)

## 2019-02-13 LAB — HEMOGLOBIN A1C
Est. average glucose Bld gHb Est-mCnc: 117 mg/dL
Hgb A1c MFr Bld: 5.7 % — ABNORMAL HIGH (ref 4.8–5.6)

## 2019-02-13 LAB — HEPATITIS C ANTIBODY: Hep C Virus Ab: <0.1 s/co ratio (ref 0.0–0.9)

## 2019-03-09 ENCOUNTER — Ambulatory Visit: Payer: 59 | Admitting: Adult Health

## 2019-03-16 ENCOUNTER — Other Ambulatory Visit: Payer: Self-pay

## 2019-03-16 ENCOUNTER — Emergency Department
Admission: EM | Admit: 2019-03-16 | Discharge: 2019-03-16 | Disposition: A | Discharge status: Home / Self Care | Payer: 59 | Attending: Emergency Medicine | Admitting: Emergency Medicine

## 2019-03-16 ENCOUNTER — Encounter: Payer: Self-pay | Admitting: Emergency Medicine

## 2019-03-16 DIAGNOSIS — Z79899 Other long term (current) drug therapy: Secondary | ICD-10-CM | POA: Insufficient documentation

## 2019-03-16 DIAGNOSIS — Z20828 Contact with and (suspected) exposure to other viral communicable diseases: Secondary | ICD-10-CM | POA: Insufficient documentation

## 2019-03-16 DIAGNOSIS — Z20822 Contact with and (suspected) exposure to covid-19: Secondary | ICD-10-CM

## 2019-03-16 DIAGNOSIS — I10 Essential (primary) hypertension: Secondary | ICD-10-CM | POA: Diagnosis not present

## 2019-03-16 DIAGNOSIS — Z1159 Encounter for screening for other viral diseases: Secondary | ICD-10-CM | POA: Diagnosis not present

## 2019-03-16 NOTE — ED Provider Notes (Signed)
[facility] Emergency Department Provider Note   ____________________________________________    I have reviewed the triage vital signs and the nursing notes.   HISTORY  Chief Complaint Labs Only     HPI Carl Walker is a 68 y.o. male who has no symptoms but requires covid PCR test for international travel. No cough, no sob, no fever   Past Medical History:  Diagnosis Date  . Hypertension   . Seasonal allergies   . Severe aortic stenosis     Patient Active Problem List   Diagnosis Date Noted  . Encounter for hepatitis C screening test for low risk patient 02/12/2019  . Vaccine counseling 02/12/2019  . S/P AVR (aortic valve replacement) 02/12/2019  . Need for hepatitis A immunization 02/12/2019  . Hypertension   . Essential hypertension, benign 12/18/2011  . Aortic valve stenosis 11/15/2011  . Heart murmur 11/08/2011  . Allergic rhinitis, cause unspecified 11/08/2011    Past Surgical History:  Procedure Laterality Date  . AORTIC VALVULOPLASTY  2018    Prior to Admission medications   Medication Sig Start Date End Date Taking? Authorizing Provider  amLODipine (NORVASC) 5 MG tablet Take 1 tablet (5 mg total) by mouth daily. 02/12/19   Tysinger, Kermit Balo, PA-C  atovaquone-proguanil (MALARONE) 250-100 MG TABS tablet Begin 1 week prior to trip, daily during and 1 week after trip 02/12/19   Jac Canavan, PA-C  ciprofloxacin (CIPRO) 500 MG tablet Use 1 tablet BID x 5 days for travelers diarrhea, BID x 7 days for sinus infection or urinary infection 02/12/19   Tysinger, Kermit Balo, PA-C  mupirocin ointment (BACTROBAN) 2 % Apply 1 application topically 3 (three) times daily. 02/12/19   Tysinger, Kermit Balo, PA-C     Allergies Patient has no known allergies.  Family History  Problem Relation Age of Onset  . Other Father        died from poisoning  . Other Brother        MVA  . Heart disease Neg Hx   . Stroke Neg Hx   . Diabetes Neg  Hx   . Hypertension Neg Hx   . Cancer Neg Hx     Social History Social History   Tobacco Use  . Smoking status: Never Smoker  . Smokeless tobacco: Never Used  Substance Use Topics  . Alcohol use: Not Currently    Comment: 1 drink per month.  . Drug use: No    Review of Systems  Constitutional: No fever/chills  ENT: No sore throat.  Respiratory: Denies shortness of breath.  Musculoskeletal: Negative for myalgias Skin: Negative for rash. Neurological: Negative for headaches or weakness   ____________________________________________   PHYSICAL EXAM:  VITAL SIGNS: ED Triage Vitals  Enc Vitals Group     BP 03/16/19 1349 (!) 146/96     Pulse Rate 03/16/19 1349 73     Resp 03/16/19 1349 18     Temp 03/16/19 1349 98.5 F (36.9 C)     Temp Source 03/16/19 1349 Oral     SpO2 03/16/19 1349 98 %     Weight 03/16/19 1350 79.4 kg (175 lb)     Height 03/16/19 1350 1.753 m (5\' 9" )     Head Circumference --      Peak Flow --      Pain Score 03/16/19 1402 0     Pain Loc --      Pain Edu? --      Excl. in GC? --  Constitutional: Alert and oriented. No acute distress. Pleasant and interactive    Respiratory: Normal respiratory effort.  No retractions.    Musculoskeletal:   Warm and well perfused Neurologic:  Normal speech and language. No gross focal neurologic deficits are appreciated.  Skin:  Skin is warm, dry and intact. No rash noted. Psychiatric: Mood and affect are normal. Speech and behavior are normal.  ____________________________________________   LABS (all labs ordered are listed, but only abnormal results are displayed)  Labs Reviewed  SARS CORONAVIRUS 2 (TAT 6-24 HRS)   ____________________________________________  EKG   ____________________________________________  RADIOLOGY   ____________________________________________   PROCEDURES  Procedure(s) performed: No  Procedures   Critical Care performed: No  ____________________________________________   INITIAL IMPRESSION / ASSESSMENT AND PLAN / ED COURSE  Pertinent labs & imaging results that were available during my care of the patient were reviewed by me and considered in my medical decision making (see chart for details).  Patient realized that he is too early as he needs PCR test within 72 hours of flight.     ____________________________________________   FINAL CLINICAL IMPRESSION(S) / ED DIAGNOSES  Final diagnoses:  Encounter for laboratory testing for COVID-19 virus        Note:  This document was prepared using Dragon voice recognition software and may include unintentional dictation errors.   Lavonia Drafts, MD 03/16/19 [phone]

## 2019-03-16 NOTE — ED Notes (Signed)
ED Provider at bedside. 

## 2019-03-16 NOTE — ED Triage Notes (Signed)
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  [place] is [place].

## 2019-03-16 NOTE — ED Notes (Signed)
See triage note  Here for COVID test  No other complaints

## 2019-03-17 ENCOUNTER — Other Ambulatory Visit: Payer: Self-pay

## 2019-03-17 DIAGNOSIS — Z20822 Contact with and (suspected) exposure to covid-19: Secondary | ICD-10-CM

## 2019-03-19 LAB — NOVEL CORONAVIRUS, NAA: SARS-CoV-2, NAA: NOT DETECTED

## 2019-11-19 ENCOUNTER — Ambulatory Visit: Payer: Self-pay | Attending: Internal Medicine

## 2019-11-19 DIAGNOSIS — Z20822 Contact with and (suspected) exposure to covid-19: Secondary | ICD-10-CM | POA: Insufficient documentation

## 2019-11-20 LAB — NOVEL CORONAVIRUS, NAA

## 2020-05-03 ENCOUNTER — Ambulatory Visit: Payer: Self-pay | Attending: Internal Medicine

## 2020-05-03 DIAGNOSIS — Z23 Encounter for immunization: Secondary | ICD-10-CM

## 2020-05-03 NOTE — Progress Notes (Signed)
   Covid-19 Vaccination Clinic  Name:  Carl Walker    MRN: [id number] DOB: 09/20/1950  05/03/2020  Carl Walker was observed post Covid-19 immunization for 15 minutes without incident. He was provided with Vaccine Information Sheet and instruction to access the V-Safe system.   Carl Walker was instructed to call 911 with any severe reactions post vaccine: . Difficulty breathing  . Swelling of face and throat  . A fast heartbeat  . A bad rash all over body  . Dizziness and weakness   Immunizations Administered    Name Date Dose VIS Date Route   Pfizer COVID-19 Vaccine 05/03/2020  1:26 PM 0.3 mL 02/10/2020 Intramuscular   Manufacturer: Pfizer, Inc   Lot: [id number]   NDC: [address]-2     

## 2020-05-03 NOTE — Progress Notes (Signed)
   Covid-19 Vaccination Clinic  Name:  Carl Walker    MRN: [id number] DOB: [birth year]  05/03/2020  [name] was observed post Covid-19 immunization for 15 minutes without incident. He was provided with Vaccine Information Sheet and instruction to access the V-Safe system.   [name] was instructed to call 911 with any severe reactions post vaccine: Marland Kitchen Difficulty breathing  . Swelling of face and throat  . A fast heartbeat  . A bad rash all over body  . Dizziness and weakness   Immunizations Administered    Name Date Dose VIS Date Route   Pfizer COVID-19 Vaccine 05/03/2020  1:26 PM 0.3 mL 02/10/2020 Intramuscular   Manufacturer: [employer]   Lot: [id number]   NDC: [address]-4

## 2020-06-09 ENCOUNTER — Other Ambulatory Visit: Payer: Self-pay

## 2020-06-09 DIAGNOSIS — Z20822 Contact with and (suspected) exposure to covid-19: Secondary | ICD-10-CM

## 2020-06-10 LAB — SARS-COV-2, NAA 2 DAY TAT

## 2020-06-10 LAB — NOVEL CORONAVIRUS, NAA: SARS-CoV-2, NAA: NOT DETECTED
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
## 2021-12-27 ENCOUNTER — Encounter: Payer: Self-pay | Admitting: Internal Medicine

## 2022-01-30 ENCOUNTER — Encounter: Payer: Self-pay | Admitting: Internal Medicine

## 2023-05-10 ENCOUNTER — Encounter: Payer: Self-pay | Admitting: Medical

## 2023-05-10 ENCOUNTER — Ambulatory Visit (INDEPENDENT_AMBULATORY_CARE_PROVIDER_SITE_OTHER): Payer: Medicare Other | Admitting: Medical

## 2023-05-10 VITALS — BP 132/80 | HR 92 | Ht 66.25 in | Wt 172.0 lb

## 2023-05-10 DIAGNOSIS — Z1389 Encounter for screening for other disorder: Secondary | ICD-10-CM | POA: Diagnosis not present

## 2023-05-10 DIAGNOSIS — I10 Essential (primary) hypertension: Secondary | ICD-10-CM | POA: Diagnosis not present

## 2023-05-10 DIAGNOSIS — I350 Nonrheumatic aortic (valve) stenosis: Secondary | ICD-10-CM

## 2023-05-10 DIAGNOSIS — Z131 Encounter for screening for diabetes mellitus: Secondary | ICD-10-CM

## 2023-05-10 DIAGNOSIS — Z0000 Encounter for general adult medical examination without abnormal findings: Secondary | ICD-10-CM

## 2023-05-10 DIAGNOSIS — Z5320 Procedure and treatment not carried out because of patient's decision for unspecified reasons: Secondary | ICD-10-CM

## 2023-05-10 DIAGNOSIS — L253 Unspecified contact dermatitis due to other chemical products: Secondary | ICD-10-CM

## 2023-05-10 DIAGNOSIS — N529 Male erectile dysfunction, unspecified: Secondary | ICD-10-CM | POA: Diagnosis not present

## 2023-05-10 DIAGNOSIS — R5383 Other fatigue: Secondary | ICD-10-CM | POA: Diagnosis not present

## 2023-05-10 DIAGNOSIS — Z125 Encounter for screening for malignant neoplasm of prostate: Secondary | ICD-10-CM

## 2023-05-10 DIAGNOSIS — Z952 Presence of prosthetic heart valve: Secondary | ICD-10-CM

## 2023-05-10 DIAGNOSIS — Z13220 Encounter for screening for lipoid disorders: Secondary | ICD-10-CM

## 2023-05-10 DIAGNOSIS — R351 Nocturia: Secondary | ICD-10-CM

## 2023-05-10 LAB — POCT URINALYSIS DIP (PROADVANTAGE DEVICE)
Bilirubin, UA: NEGATIVE
Blood, UA: NEGATIVE
Glucose, UA: NEGATIVE mg/dL
Ketones, POC UA: NEGATIVE mg/dL
Leukocytes, UA: NEGATIVE
Nitrite, UA: NEGATIVE
Protein Ur, POC: NEGATIVE mg/dL
Specific Gravity, Urine: 1.01
Urobilinogen, Ur: 0.2
pH, UA: 7.5 (ref 5.0–8.0)

## 2023-05-10 LAB — LDL CHOLESTEROL, DIRECT

## 2023-05-10 NOTE — Progress Notes (Signed)
Subjective:   HPI  Carl Walker is a 73 y.o. male who presents for Chief Complaint  Patient presents with   Annual Exam    Non fasting. [employer] Big Breakfast around 10:30 am.     Patient Care Team: Anyi Fels, Kermit Balo, PA-C as PCP - General (Family Medicine) Tonny Bollman, MD as Consulting Physician (Cardiology) Delight Ovens, MD (Inactive) as Consulting Physician (Cardiothoracic Surgery)   Concerns: Here for well visit.  Last visit 2020.  He is retired.  Former Designer, jewellery.  He is from [place] and just got back from a 72-month stay in the gym area.  He has concerns about erections, not full erections, having trouble keeping erections.  He wants his testosterone checked.  No prior use of Viagra or similar.  He notes that certain types of deodorant causes him problems and bumps in the underarm.  When he uses spray due to he does not seem to the problem.  Otherwise in usual state of health.  He declines colon cancer screening.  He has history of AV valve replacement but has no problems with chest pain or shortness of breath.  Reviewed their medical, surgical, family, social, medication, and allergy history and updated chart as appropriate.  No Known Allergies  Past Medical History:  Diagnosis Date   Hypertension    Seasonal allergies    Severe aortic stenosis     Current Outpatient Medications on File Prior to Visit  Medication Sig Dispense Refill   amLODipine (NORVASC) 5 MG tablet Take 1 tablet (5 mg total) by mouth daily. (Patient not taking: Reported on 05/10/2023) 180 tablet 0   No current facility-administered medications on file prior to visit.      Current Outpatient Medications:    amLODipine (NORVASC) 5 MG tablet, Take 1 tablet (5 mg total) by mouth daily. (Patient not taking: Reported on 05/10/2023), Disp: 180 tablet, Rfl: 0  Family History  Problem Relation Age of Onset   Other Father        died from poisoning   Other Brother         MVA   Heart disease Neg Hx    Stroke Neg Hx    Diabetes Neg Hx    Hypertension Neg Hx    Cancer Neg Hx     Past Surgical History:  Procedure Laterality Date   AORTIC VALVULOPLASTY  2018    Review of Systems  Constitutional:  Positive for malaise/fatigue. Negative for chills, fever and weight loss.  HENT:  Negative for congestion, ear pain, hearing loss, sore throat and tinnitus.   Eyes:  Negative for blurred vision, pain and redness.  Respiratory:  Negative for cough, hemoptysis and shortness of breath.   Cardiovascular:  Negative for chest pain, palpitations, orthopnea, claudication and leg swelling.  Gastrointestinal:  Negative for abdominal pain, blood in stool, constipation, diarrhea, nausea and vomiting.  Genitourinary:  Negative for dysuria, flank pain, frequency, hematuria and urgency.  Musculoskeletal:  Negative for falls, joint pain and myalgias.  Skin:  Negative for itching and rash.  Neurological:  Negative for dizziness, tingling, speech change, weakness and headaches.  Endo/Heme/Allergies:  Negative for polydipsia. Does not bruise/bleed easily.  Psychiatric/Behavioral:  Negative for depression and memory loss. The patient is not nervous/anxious and does not have insomnia.        Objective:  BP 132/80   Pulse 92   Ht 5' 6.25" (1.683 m)   Wt 172 lb (78 kg)   SpO2 99%   BMI 27.55  kg/m   General appearance: alert, no distress, WD/WN, African American male Skin: Unremarkable HEENT: normocephalic, conjunctiva/corneas normal, sclerae anicteric, PERRLA, EOMi, nares patent, no discharge or erythema, pharynx normal Oral cavity: MMM, tongue normal, teeth normal Neck: supple, no lymphadenopathy, no thyromegaly, no masses, normal ROM, no bruits Chest: Midline surgical scars, non tender, normal shape and expansion Heart: 2-3 out of 6 brief holosystolic murmur heard in the upper sternal borders, otherwise RRR, normal S1, S2 Lungs: CTA bilaterally, no wheezes, rhonchi, or  rales Abdomen: +bs, soft, non tender, non distended, no masses, no hepatomegaly, no splenomegaly, no bruits Back: non tender, normal ROM, no scoliosis Musculoskeletal: upper extremities non tender, no obvious deformity, normal ROM throughout, lower extremities non tender, no obvious deformity, normal ROM throughout Extremities: no edema, no cyanosis, no clubbing Pulses: 2+ symmetric, upper and lower extremities, normal cap refill Neurological: alert, oriented x 3, CN2-12 intact, strength normal upper extremities and lower extremities, sensation normal throughout, DTRs 2+ throughout, no cerebellar signs, gait normal Psychiatric: normal affect, behavior normal, pleasant  GU: normal male external genitalia, nontender, no masses, no hernia, no lymphadenopathy Rectal: Deferred  EKG reviewed, abnormal but no acute change compared to 2014 EKG   Assessment and Plan :   Encounter Diagnoses  Name Primary?   Encounter for health maintenance examination in adult Yes   Screening for hematuria or proteinuria    Screening for prostate cancer    Colonoscopy refused    Erectile dysfunction, unspecified erectile dysfunction type    Fatigue, unspecified type    Screening for diabetes mellitus    Contact dermatitis due to other chemical product, unspecified contact dermatitis type    Essential hypertension, benign    Aortic valve stenosis, etiology of cardiac valve disease unspecified    S/P AVR (aortic valve replacement)    Screening for lipid disorders    Nocturia     This visit was a preventative care visit, also known as wellness visit or routine physical.   Topics typically include healthy lifestyle, diet, exercise, preventative care, vaccinations, sick and well care, proper use of emergency dept and after hours care, as well as other concerns.     Separate significant issues discussed: Erectile dysfunction-we will check labs including testosterone today.  I recommend a trial of medicine such  as Cialis or Viagra to help with erections.  You can take 1/2 to 1 tablet 30 to 40 minutes before sexual intercourse.  Do not take any more than 1 tablet in a 24-hour time period.  If prolonged erection, lightheaded dizziness or other symptom get checked out right away  Decreased energy-this could be related to low testosterone, sleep apnea, anemia or other.  We will check some labs today in this regard    General Recommendations: Continue to return yearly for your annual wellness and preventative care visits.  This gives [place] a chance to discuss healthy lifestyle, exercise, vaccinations, review your chart record, and perform screenings where appropriate.  I recommend you see your eye doctor yearly for routine vision care.  I recommend you see your dentist yearly for routine dental care including hygiene visits twice yearly.   Vaccination  Immunization History  Administered Date(s) Administered   Hepatitis A, Adult 02/12/2019   Influenza, Seasonal, Injecte, Preservative Fre 01/22/2017, 02/14/2018   PFIZER(Purple Top)SARS-COV-2 Vaccination 05/03/2020   PPD Test 09/12/2011    According to our chart you are due for several vaccines including shingles vaccine, flu vaccine, tetanus vaccine, pneumonia vaccine.  We will try to  get a copy of your vaccine records from [facility].   Screening for cancer: Colon cancer screening: You declined colon cancer screening   Prostate Cancer screening: The recommended prostate cancer screening test is a blood test called the prostate-specific antigen (PSA) test. PSA is a protein that is made in the prostate. As you age, your prostate naturally produces more PSA. Abnormally high PSA levels may be caused by: Prostate cancer. An enlarged prostate that is not caused by cancer (benign prostatic hyperplasia, or BPH). This condition is very common in older men. A prostate gland infection (prostatitis) or urinary tract infection. Certain medicines such as male  hormones (like testosterone) or other medicines that raise testosterone levels. A rectal exam may be done as part of prostate cancer screening to help provide information about the size of your prostate gland. When a rectal exam is performed, it should be done after the PSA level is drawn to avoid any effect on the results.   Skin cancer screening: Check your skin regularly for new changes, growing lesions, or other lesions of concern Come in for evaluation if you have skin lesions of concern.   Lung cancer screening: If you have a greater than 20 pack year history of tobacco use, then you may qualify for lung cancer screening with a chest CT scan.   Please call your insurance company to inquire about coverage for this test.   Pancreatic cancer:  no current screening test is available or routinely recommended. (risk factors: smoking, overweight or obese, diabetes, chronic pancreatitis, work exposure - dry cleaning, metal working, 73yo>, M>F, Tree surgeon, family hx/o, hereditary breast, ovarian, melanoma, lynch, peutz-jeghers).  Symptoms: jaundice, dark urine, light color or greasy stools, itchy skin, belly or back pain, weight loss, poor appetite, nausea, vomiting, liver enlargement, DVT/blood clots.   We currently don't have screenings for other cancers besides breast, cervical, colon, and lung cancers.  If you have a strong family history of cancer or have other cancer screening concerns, please let me know.  Genetic testing referral is an option for individuals with high cancer risk in the family.  There are some other cancer screenings in development currently.   Bone health: Get at least 150 minutes of aerobic exercise weekly Get weight bearing exercise at least once weekly Bone density test:  A bone density test is an imaging test that uses a type of X-ray to measure the amount of calcium and other minerals in your bones. The test may be used to diagnose or screen you for a  condition that causes weak or thin bones (osteoporosis), predict your risk for a broken bone (fracture), or determine how well your osteoporosis treatment is working. The bone density test is recommended for females 65 and older, or females or males <65 if certain risk factors such as thyroid disease, long term use of steroids such as for asthma or rheumatological issues, vitamin D deficiency, estrogen deficiency, family history of osteoporosis, self or family history of fragility fracture in first degree relative.    Heart health: Get at least 150 minutes of aerobic exercise weekly Limit alcohol It is important to maintain a healthy blood pressure and healthy cholesterol numbers  Heart disease screening: Screening for heart disease includes screening for blood pressure, fasting lipids, glucose/diabetes screening, BMI height to weight ratio, reviewed of smoking status, physical activity, and diet.    Goals include blood pressure 120/80 or less, maintaining a healthy lipid/cholesterol profile, preventing diabetes or keeping diabetes numbers under good control,  not smoking or using tobacco products, exercising most days per week or at least 150 minutes per week of exercise, and eating healthy variety of fruits and vegetables, healthy oils, and avoiding unhealthy food choices like fried food, fast food, high sugar and high cholesterol foods.    Other tests may possibly include EKG test, CT coronary calcium score, echocardiogram, exercise treadmill stress test.     Given your history of AV valve replacement I would recommend either a follow-up with cardiology or consider a baseline updated screen including CT coronary calcium test to look for cholesterol buildup in your coronary arteries of your heart and possibly updated echocardiogram for surveillance  Let me know if you may refer you back to cardiology   Vascular disease screening: For higher risk individuals including smokers, diabetics,  patients with known heart disease or high blood pressure, kidney disease, and others, screening for vascular disease or atherosclerosis of the arteries is available.  Examples may include carotid ultrasound, abdominal aortic ultrasound, ABI blood flow screening in the legs, thoracic aorta screening.    Medical care options: I recommend you continue to seek care here first for routine care.  We try really hard to have available appointments Monday through Friday daytime hours for sick visits, acute visits, and physicals.  Urgent care should be used for after hours and weekends for significant issues that cannot wait till the next day.  The emergency department should be used for significant potentially life-threatening emergencies.  The emergency department is expensive, can often have long wait times for less significant concerns, so try to utilize primary care, urgent care, or telemedicine when possible to avoid unnecessary trips to the emergency department.  Virtual visits and telemedicine have been introduced since the pandemic started in 2020, and can be convenient ways to receive medical care.  We offer virtual appointments as well to assist you in a variety of options to seek medical care.   Legal  Take the time to do a last will and testament, Advanced Directives including Health Care Power of Attorney and Living Will documents.  Don't leave your family with burdens that can be handled ahead of time.   Advanced Directives: I recommend you consider completing a Health Care Power of Attorney and Living Will.   These documents respect your wishes and help alleviate burdens on your loved ones if you were to become terminally ill or be in a position to need those documents enforced.    You can complete Advanced Directives yourself, have them notarized, then have copies made for our office, for you and for anybody you feel should have them in safe keeping.  Or, you can have an attorney prepare  these documents.   If you haven't updated your Last Will and Testament in a while, it may be worthwhile having an attorney prepare these documents together and save on some costs.       Spiritual and Emotional Health Keeping a healthy spiritual life can help you better manage your physical health. Your spiritual life can help you to cope with any issues that may arise with your physical health.  Balance can keep [place] healthy and help [place] to recover.  If you are struggling with your spiritual health there are questions that you may want to ask yourself:  What makes me feel most complete? When do I feel most connected to the rest of the world? Where do I find the most inner strength? What am I doing when I feel whole?  Helpful tips: Being in nature. Some people feel very connected and at peace when they are walking outdoors or are outside. Helping others. Some feel the largest sense of wellbeing when they are of service to others. Being of service can take on many forms. It can be doing volunteer work, being kind to strangers, or offering a hand to a friend in need. Gratitude. Some people find they feel the most connected when they remain grateful. They may make lists of all the things they are grateful for or say a thank you out loud for all they have.    Emotional Health Are you in tune with your emotional health?  Check out this link: http://www.marquez-love.com/    Financial Health Make sure you use a budget for your personal finances Make sure you are insured against risks (health insurance, life insurance, auto insurance, etc) Save more, spend less Set financial goals If you need help in this area, good resources include counseling through [employer] or other community resources, have a meeting with a Social research officer, government, and a good resource is the [employer]    Carl Walker was seen today for annual exam.  Diagnoses and all orders for this  visit:  Encounter for health maintenance examination in adult -     Comprehensive metabolic panel -     CBC with Differential/Platelet -     Hemoglobin A1c -     TSH -     Testosterone -     PSA -     EKG 12-Lead -     POCT Urinalysis DIP (Proadvantage Device) -     LDL Cholesterol, Direct  Screening for hematuria or proteinuria -     CBC with Differential/Platelet  Screening for prostate cancer -     PSA  Colonoscopy refused  Erectile dysfunction, unspecified erectile dysfunction type -     EKG 12-Lead  Fatigue, unspecified type -     CBC with Differential/Platelet -     TSH -     Testosterone -     EKG 12-Lead  Screening for diabetes mellitus -     Hemoglobin A1c  Contact dermatitis due to other chemical product, unspecified contact dermatitis type  Essential hypertension, benign -     CBC with Differential/Platelet -     EKG 12-Lead -     LDL Cholesterol, Direct  Aortic valve stenosis, etiology of cardiac valve disease unspecified -     EKG 12-Lead -     LDL Cholesterol, Direct  S/P AVR (aortic valve replacement)  Screening for lipid disorders -     LDL Cholesterol, Direct  Nocturia -     PSA   Follow-up pending labs, yearly for physical

## 2023-05-10 NOTE — Patient Instructions (Signed)
This visit was a preventative care visit, also known as wellness visit or routine physical.   Topics typically include healthy lifestyle, diet, exercise, preventative care, vaccinations, sick and well care, proper use of emergency dept and after hours care, as well as other concerns.     Separate significant issues discussed: Erectile dysfunction-we will check labs including testosterone today.  I recommend a trial of medicine such as Cialis or Viagra to help with erections.  You can take 1/2 to 1 tablet 30 to 40 minutes before sexual intercourse.  Do not take any more than 1 tablet in a 24-hour time period.  If prolonged erection, lightheaded dizziness or other symptom get checked out right away  Decreased energy-this could be related to low testosterone, sleep apnea, anemia or other.  We will check some labs today in this regard    General Recommendations: Continue to return yearly for your annual wellness and preventative care visits.  This gives [place] a chance to discuss healthy lifestyle, exercise, vaccinations, review your chart record, and perform screenings where appropriate.  I recommend you see your eye doctor yearly for routine vision care.  I recommend you see your dentist yearly for routine dental care including hygiene visits twice yearly.   Vaccination  Immunization History  Administered Date(s) Administered   Hepatitis A, Adult 02/12/2019   Influenza, Seasonal, Injecte, Preservative Fre 01/22/2017, 02/14/2018   PFIZER(Purple Top)SARS-COV-2 Vaccination 05/03/2020   PPD Test 09/12/2011    According to our chart you are due for several vaccines including shingles vaccine, flu vaccine, tetanus vaccine, pneumonia vaccine.  We will try to get a copy of your vaccine records from [facility] health.   Screening for cancer: Colon cancer screening: You declined colon cancer screening   Prostate Cancer screening: The recommended prostate cancer screening test is a blood test called  the prostate-specific antigen (PSA) test. PSA is a protein that is made in the prostate. As you age, your prostate naturally produces more PSA. Abnormally high PSA levels may be caused by: Prostate cancer. An enlarged prostate that is not caused by cancer (benign prostatic hyperplasia, or BPH). This condition is very common in older men. A prostate gland infection (prostatitis) or urinary tract infection. Certain medicines such as male hormones (like testosterone) or other medicines that raise testosterone levels. A rectal exam may be done as part of prostate cancer screening to help provide information about the size of your prostate gland. When a rectal exam is performed, it should be done after the PSA level is drawn to avoid any effect on the results.   Skin cancer screening: Check your skin regularly for new changes, growing lesions, or other lesions of concern Come in for evaluation if you have skin lesions of concern.   Lung cancer screening: If you have a greater than 20 pack year history of tobacco use, then you may qualify for lung cancer screening with a chest CT scan.   Please call your insurance company to inquire about coverage for this test.   Pancreatic cancer:  no current screening test is available or routinely recommended. (risk factors: smoking, overweight or obese, diabetes, chronic pancreatitis, work exposure - dry cleaning, metal working, 73yo>, M>F, Tree surgeon, family hx/o, hereditary breast, ovarian, melanoma, lynch, peutz-jeghers).  Symptoms: jaundice, dark urine, light color or greasy stools, itchy skin, belly or back pain, weight loss, poor appetite, nausea, vomiting, liver enlargement, DVT/blood clots.   We currently don't have screenings for other cancers besides breast, cervical, colon, and lung  cancers.  If you have a strong family history of cancer or have other cancer screening concerns, please let me know.  Genetic testing referral is an option for  individuals with high cancer risk in the family.  There are some other cancer screenings in development currently.   Bone health: Get at least 150 minutes of aerobic exercise weekly Get weight bearing exercise at least once weekly Bone density test:  A bone density test is an imaging test that uses a type of X-ray to measure the amount of calcium and other minerals in your bones. The test may be used to diagnose or screen you for a condition that causes weak or thin bones (osteoporosis), predict your risk for a broken bone (fracture), or determine how well your osteoporosis treatment is working. The bone density test is recommended for females 65 and older, or females or males <65 if certain risk factors such as thyroid disease, long term use of steroids such as for asthma or rheumatological issues, vitamin D deficiency, estrogen deficiency, family history of osteoporosis, self or family history of fragility fracture in first degree relative.    Heart health: Get at least 150 minutes of aerobic exercise weekly Limit alcohol It is important to maintain a healthy blood pressure and healthy cholesterol numbers  Heart disease screening: Screening for heart disease includes screening for blood pressure, fasting lipids, glucose/diabetes screening, BMI height to weight ratio, reviewed of smoking status, physical activity, and diet.    Goals include blood pressure 120/80 or less, maintaining a healthy lipid/cholesterol profile, preventing diabetes or keeping diabetes numbers under good control, not smoking or using tobacco products, exercising most days per week or at least 150 minutes per week of exercise, and eating healthy variety of fruits and vegetables, healthy oils, and avoiding unhealthy food choices like fried food, fast food, high sugar and high cholesterol foods.    Other tests may possibly include EKG test, CT coronary calcium score, echocardiogram, exercise treadmill stress test.      Given your history of AV valve replacement I would recommend either a follow-up with cardiology or consider a baseline updated screen including CT coronary calcium test to look for cholesterol buildup in your coronary arteries of your heart and possibly updated echocardiogram for surveillance  Let me know if you may refer you back to cardiology   Vascular disease screening: For higher risk individuals including smokers, diabetics, patients with known heart disease or high blood pressure, kidney disease, and others, screening for vascular disease or atherosclerosis of the arteries is available.  Examples may include carotid ultrasound, abdominal aortic ultrasound, ABI blood flow screening in the legs, thoracic aorta screening.    Medical care options: I recommend you continue to seek care here first for routine care.  We try really hard to have available appointments Monday through Friday daytime hours for sick visits, acute visits, and physicals.  Urgent care should be used for after hours and weekends for significant issues that cannot wait till the next day.  The emergency department should be used for significant potentially life-threatening emergencies.  The emergency department is expensive, can often have long wait times for less significant concerns, so try to utilize primary care, urgent care, or telemedicine when possible to avoid unnecessary trips to the emergency department.  Virtual visits and telemedicine have been introduced since the pandemic started in 2020, and can be convenient ways to receive medical care.  We offer virtual appointments as well to assist you in a variety  of options to seek medical care.   Legal  Take the time to do a last will and testament, Advanced Directives including Health Care Power of Attorney and Living Will documents.  Don't leave your family with burdens that can be handled ahead of time.   Advanced Directives: I recommend you consider completing a  Health Care Power of Attorney and Living Will.   These documents respect your wishes and help alleviate burdens on your loved ones if you were to become terminally ill or be in a position to need those documents enforced.    You can complete Advanced Directives yourself, have them notarized, then have copies made for our office, for you and for anybody you feel should have them in safe keeping.  Or, you can have an attorney prepare these documents.   If you haven't updated your Last Will and Testament in a while, it may be worthwhile having an attorney prepare these documents together and save on some costs.       Spiritual and Emotional Health Keeping a healthy spiritual life can help you better manage your physical health. Your spiritual life can help you to cope with any issues that may arise with your physical health.  Balance can keep [place] healthy and help [place] to recover.  If you are struggling with your spiritual health there are questions that you may want to ask yourself:  What makes me feel most complete? When do I feel most connected to the rest of the world? Where do I find the most inner strength? What am I doing when I feel whole?  Helpful tips: Being in nature. Some people feel very connected and at peace when they are walking outdoors or are outside. Helping others. Some feel the largest sense of wellbeing when they are of service to others. Being of service can take on many forms. It can be doing volunteer work, being kind to strangers, or offering a hand to a friend in need. Gratitude. Some people find they feel the most connected when they remain grateful. They may make lists of all the things they are grateful for or say a thank you out loud for all they have.    Emotional Health Are you in tune with your emotional health?  Check out this link: http://www.marquez-love.com/    Financial Health Make sure you use a budget for your personal finances Make sure you  are insured against risks (health insurance, life insurance, auto insurance, etc) Save more, spend less Set financial goals If you need help in this area, good resources include counseling through [employer] or other community resources, have a meeting with a Social research officer, government, and a good resource is the [employer]

## 2023-05-11 LAB — COMPREHENSIVE METABOLIC PANEL
ALT: 10 IU/L (ref 0–44)
AST: 14 IU/L (ref 0–40)
Albumin: 4.5 g/dL (ref 3.9–4.9)
Alkaline Phosphatase: 66 IU/L (ref 44–121)
BUN/Creatinine Ratio: 13 (ref 10–24)
BUN: 16 mg/dL (ref 8–27)
Bilirubin Total: 0.3 mg/dL (ref 0.0–1.2)
CO2: 26 mmol/L (ref 20–29)
Calcium: 10 mg/dL (ref 8.6–10.2)
Chloride: 103 mmol/L (ref 96–106)
Creatinine, Ser: 1.22 mg/dL (ref 0.76–1.27)
Globulin, Total: 3 g/dL (ref 1.5–4.5)
Glucose: 93 mg/dL (ref 70–99)
Potassium: 5.5 mmol/L — ABNORMAL HIGH (ref 3.5–5.2)
Sodium: 142 mmol/L (ref 134–144)
Total Protein: 7.5 g/dL (ref 6.0–8.5)
eGFR: 64 mL/min/{1.73_m2} (ref >59)

## 2023-05-11 LAB — CBC WITH DIFFERENTIAL/PLATELET
Basophils Absolute: 0 10*3/uL (ref 0.0–0.2)
Basos: 1 %
EOS (ABSOLUTE): 0.1 10*3/uL (ref 0.0–0.4)
Eos: 3 %
Hematocrit: 46.1 % (ref 37.5–51.0)
Hemoglobin: 14.6 g/dL (ref 13.0–17.7)
Immature Grans (Abs): 0 10*3/uL (ref 0.0–0.1)
Immature Granulocytes: 0 %
Lymphocytes Absolute: 1.6 10*3/uL (ref 0.7–3.1)
Lymphs: 36 %
MCH: 27.8 pg (ref 26.6–33.0)
MCHC: 31.7 g/dL (ref 31.5–35.7)
MCV: 88 fL (ref 79–97)
Monocytes Absolute: 0.4 10*3/uL (ref 0.1–0.9)
Monocytes: 9 %
Neutrophils Absolute: 2.2 10*3/uL (ref 1.4–7.0)
Neutrophils: 51 %
Platelets: 239 10*3/uL (ref 150–450)
RBC: 5.26 x10E6/uL (ref 4.14–5.80)
RDW: 13.1 % (ref 11.6–15.4)
WBC: 4.4 10*3/uL (ref 3.4–10.8)

## 2023-05-11 LAB — TESTOSTERONE: Testosterone: 360 ng/dL (ref 264–916)

## 2023-05-11 LAB — HEMOGLOBIN A1C
Est. average glucose Bld gHb Est-mCnc: 123 mg/dL
Hgb A1c MFr Bld: 5.9 % — ABNORMAL HIGH (ref 4.8–5.6)

## 2023-05-11 LAB — TSH: TSH: 1.3 u[IU]/mL (ref 0.450–4.500)

## 2023-05-11 LAB — PSA: Prostate Specific Ag, Serum: 5.8 ng/mL — ABNORMAL HIGH (ref 0.0–4.0)

## 2023-05-11 LAB — LDL CHOLESTEROL, DIRECT: LDL Direct: 136 mg/dL — ABNORMAL HIGH (ref 0–99)

## 2023-05-12 NOTE — Progress Notes (Signed)
Results sent through MyChart
